# Patient Record
Sex: Female | Born: 1965 | Race: White | Hispanic: No | Marital: Married | Smoking: Current every day smoker
Health system: Southern US, Community
[De-identification: ages and names within clinical notes are randomized; demographics above are authoritative.]

## PROBLEM LIST (undated history)

## (undated) DIAGNOSIS — Z9289 Personal history of other medical treatment: Secondary | ICD-10-CM

## (undated) DIAGNOSIS — K648 Other hemorrhoids: Secondary | ICD-10-CM

## (undated) DIAGNOSIS — E785 Hyperlipidemia, unspecified: Secondary | ICD-10-CM

## (undated) DIAGNOSIS — I471 Supraventricular tachycardia, unspecified: Secondary | ICD-10-CM

## (undated) DIAGNOSIS — E0590 Thyrotoxicosis, unspecified without thyrotoxic crisis or storm: Secondary | ICD-10-CM

## (undated) DIAGNOSIS — F419 Anxiety disorder, unspecified: Secondary | ICD-10-CM

## (undated) DIAGNOSIS — R0602 Shortness of breath: Secondary | ICD-10-CM

## (undated) DIAGNOSIS — O34219 Maternal care for unspecified type scar from previous cesarean delivery: Secondary | ICD-10-CM

## (undated) HISTORY — DX: Maternal care for unspecified type scar from previous cesarean delivery: O34.219

## (undated) HISTORY — PX: ABDOMINAL HYSTERECTOMY: SHX81

## (undated) HISTORY — DX: Other hemorrhoids: K64.8

## (undated) HISTORY — DX: Shortness of breath: R06.02

## (undated) HISTORY — DX: Hyperlipidemia, unspecified: E78.5

## (undated) HISTORY — PX: ABLATION OF DYSRHYTHMIC FOCUS: SHX254

## (undated) HISTORY — PX: THYROIDECTOMY: SHX17

## (undated) HISTORY — DX: Thyrotoxicosis, unspecified without thyrotoxic crisis or storm: E05.90

## (undated) HISTORY — DX: Personal history of other medical treatment: Z92.89

---

## 1994-10-22 DIAGNOSIS — O34219 Maternal care for unspecified type scar from previous cesarean delivery: Secondary | ICD-10-CM

## 1994-10-22 HISTORY — DX: Maternal care for unspecified type scar from previous cesarean delivery: O34.219

## 2005-01-03 ENCOUNTER — Ambulatory Visit: Payer: Self-pay

## 2006-02-20 ENCOUNTER — Emergency Department: Payer: Self-pay | Admitting: General Practice

## 2008-04-07 ENCOUNTER — Ambulatory Visit: Payer: Self-pay

## 2009-02-04 ENCOUNTER — Ambulatory Visit: Payer: Self-pay | Admitting: Obstetrics & Gynecology

## 2009-02-04 HISTORY — PX: HYSTEROSALPINGOGRAM: SHX6581

## 2009-10-19 HISTORY — PX: ENDOMETRIAL BIOPSY: SHX622

## 2009-11-02 ENCOUNTER — Ambulatory Visit: Payer: Self-pay

## 2009-11-08 ENCOUNTER — Ambulatory Visit: Payer: Self-pay

## 2010-12-30 ENCOUNTER — Ambulatory Visit: Payer: Self-pay | Admitting: Family Medicine

## 2011-07-06 ENCOUNTER — Ambulatory Visit: Payer: Self-pay

## 2011-12-10 ENCOUNTER — Ambulatory Visit: Payer: Self-pay | Admitting: Internal Medicine

## 2012-08-01 DIAGNOSIS — Z9289 Personal history of other medical treatment: Secondary | ICD-10-CM

## 2012-08-01 HISTORY — DX: Personal history of other medical treatment: Z92.89

## 2013-12-30 ENCOUNTER — Ambulatory Visit: Payer: Self-pay | Admitting: Physician Assistant

## 2014-01-21 ENCOUNTER — Ambulatory Visit: Payer: Self-pay | Admitting: Physician Assistant

## 2015-01-24 ENCOUNTER — Ambulatory Visit (INDEPENDENT_AMBULATORY_CARE_PROVIDER_SITE_OTHER): Payer: BLUE CROSS/BLUE SHIELD

## 2015-01-24 ENCOUNTER — Ambulatory Visit (INDEPENDENT_AMBULATORY_CARE_PROVIDER_SITE_OTHER): Payer: BLUE CROSS/BLUE SHIELD | Admitting: Podiatry

## 2015-01-24 ENCOUNTER — Encounter: Payer: Self-pay | Admitting: Podiatry

## 2015-01-24 VITALS — BP 148/93 | HR 72 | Resp 16 | Ht 64.0 in | Wt 145.0 lb

## 2015-01-24 DIAGNOSIS — M79672 Pain in left foot: Secondary | ICD-10-CM | POA: Diagnosis not present

## 2015-01-24 DIAGNOSIS — G5762 Lesion of plantar nerve, left lower limb: Secondary | ICD-10-CM | POA: Diagnosis not present

## 2015-01-24 NOTE — Progress Notes (Signed)
   Subjective:    Patient ID: Shannon Patterson, female    DOB: 06/01/1966, 49 y.o.   MRN: [id number]  HPI    Review of Systems  Cardiovascular: Positive for palpitations.  All other systems reviewed and are negative.      Objective:   Physical Exam        Assessment & Plan:

## 2015-01-24 NOTE — Progress Notes (Signed)
   Subjective:    Patient ID: Shannon Patterson, female    DOB: [birth year], 49 y.o.   MRN: [id number] " My foot hurts "  Pt complains of left hurt hurting , the whole foot and ankle . She has seen ortho, iced, wrapped, she has stopped exercise, nothing seems to work. She has had a Xray at [facility] last week .   HPI       Review of Systems     Objective:   Physical Exam I have reviewed her past medical history medications allergies surgery social history and review of systems. Pulses are strongly palpable neurologic sensorium is remarkable for a painful Mulder's click to the third interdigital space of the left foot. Deep tendon reflexes are intact muscle strength +5 over 5 dorsiflexion plantar flexors and inverters everters all intrinsic musculature is intact. Orthopedic evaluation of his roots all joints distal to the ankle for range of motion without crepitation. Radiographic evaluation does not instructed any type of osseus abnormalities in the foot.        Assessment & Plan:  Assessment: Neuroma third interdigital space left foot.  Plan: Injected the third intermetatarsal space today with Kenalog and local anesthetic left foot. Discussed appropriate shoe gear stretching exercises ice therapy follow-up with her in 1 month.

## 2015-02-09 DIAGNOSIS — M84375A Stress fracture, left foot, initial encounter for fracture: Secondary | ICD-10-CM | POA: Insufficient documentation

## 2015-02-21 ENCOUNTER — Ambulatory Visit: Payer: BLUE CROSS/BLUE SHIELD | Admitting: Podiatry

## 2015-03-30 DIAGNOSIS — M7672 Peroneal tendinitis, left leg: Secondary | ICD-10-CM | POA: Insufficient documentation

## 2015-08-01 IMAGING — CR RIGHT INDEX FINGER 2+V
1 series · 3 of 3 positions shown · non-contrast
Comparison: none

[Series 1: pa · 0.17mm/px · 3 of 3 slices shown]
[im 1/3]
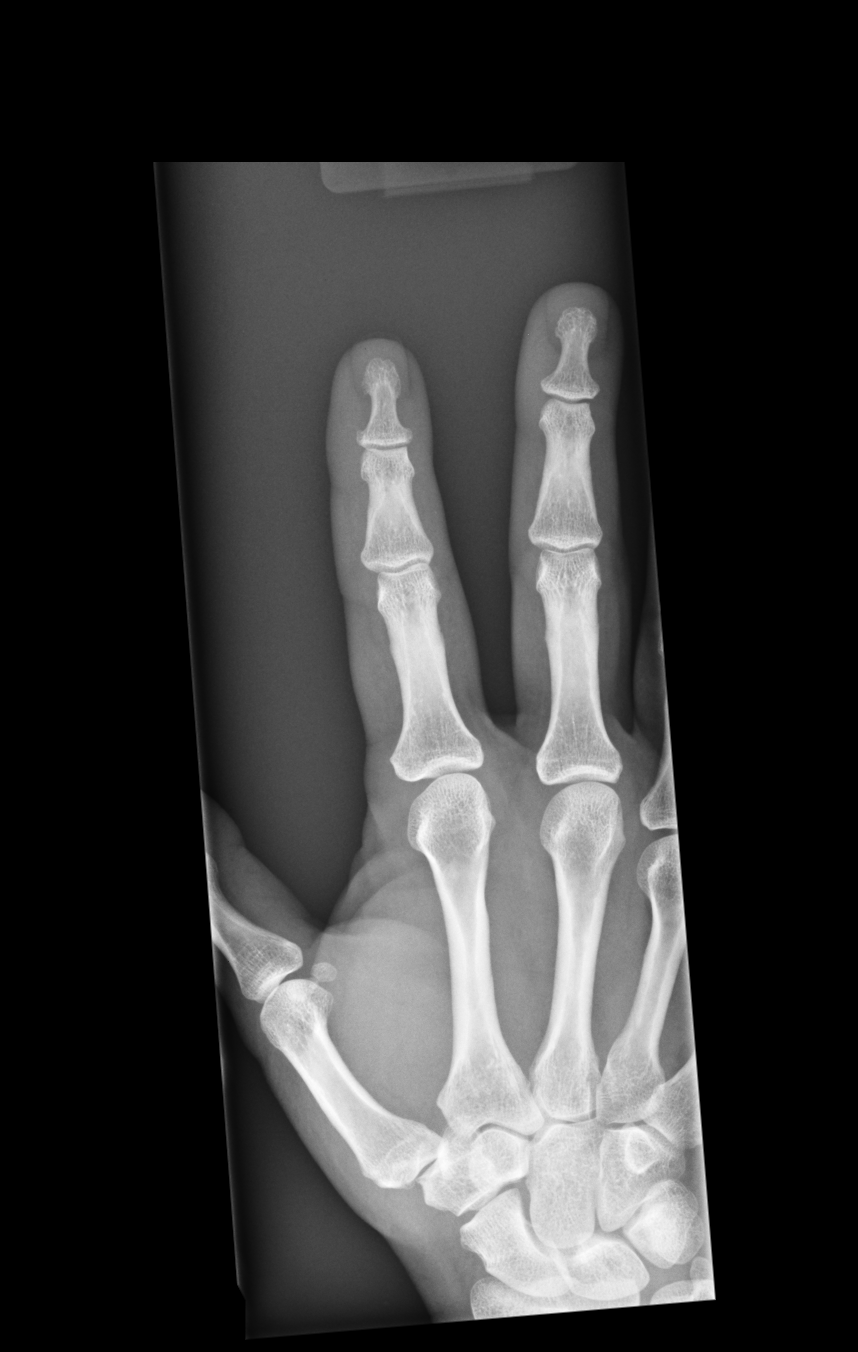
[im 2/3]
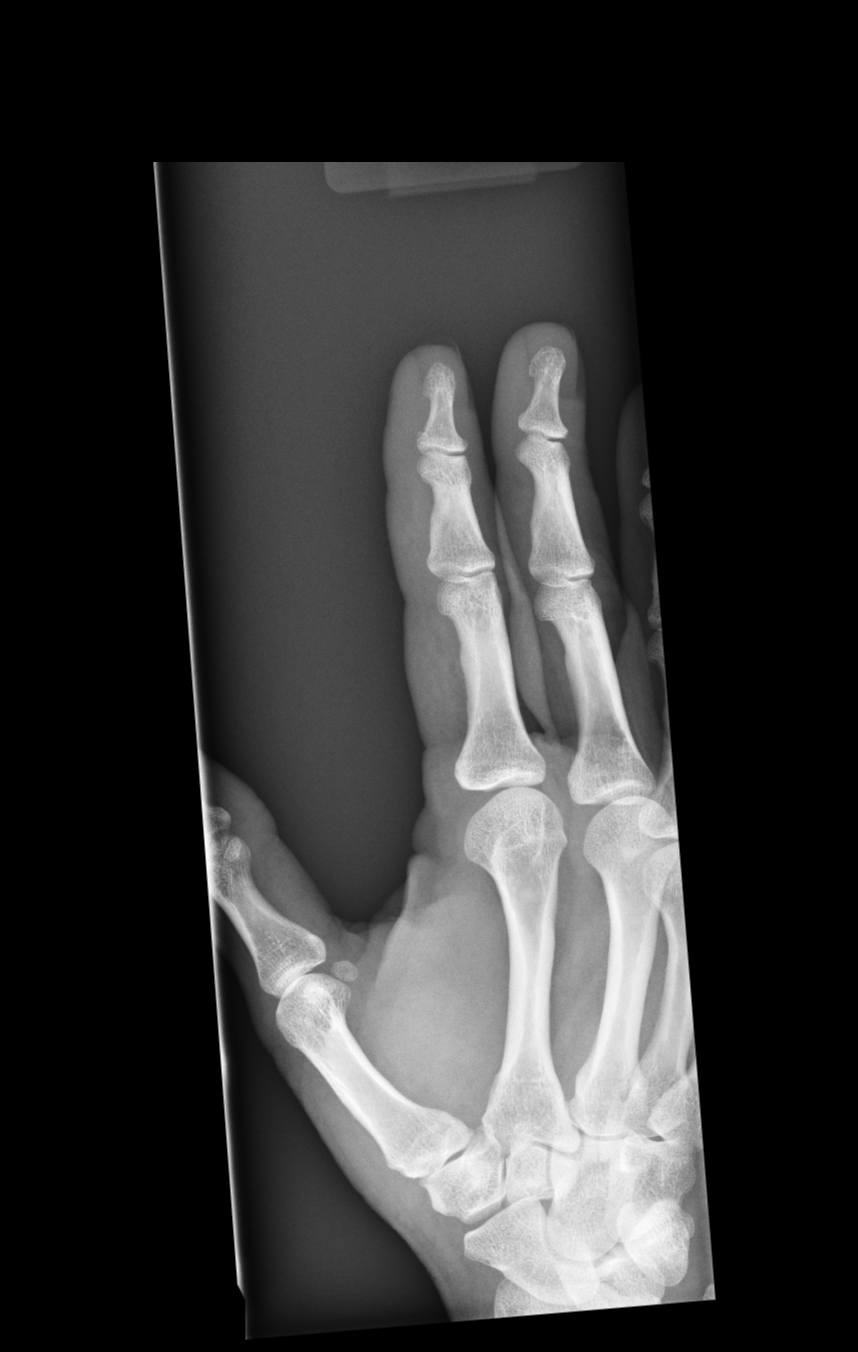
[im 3/3]
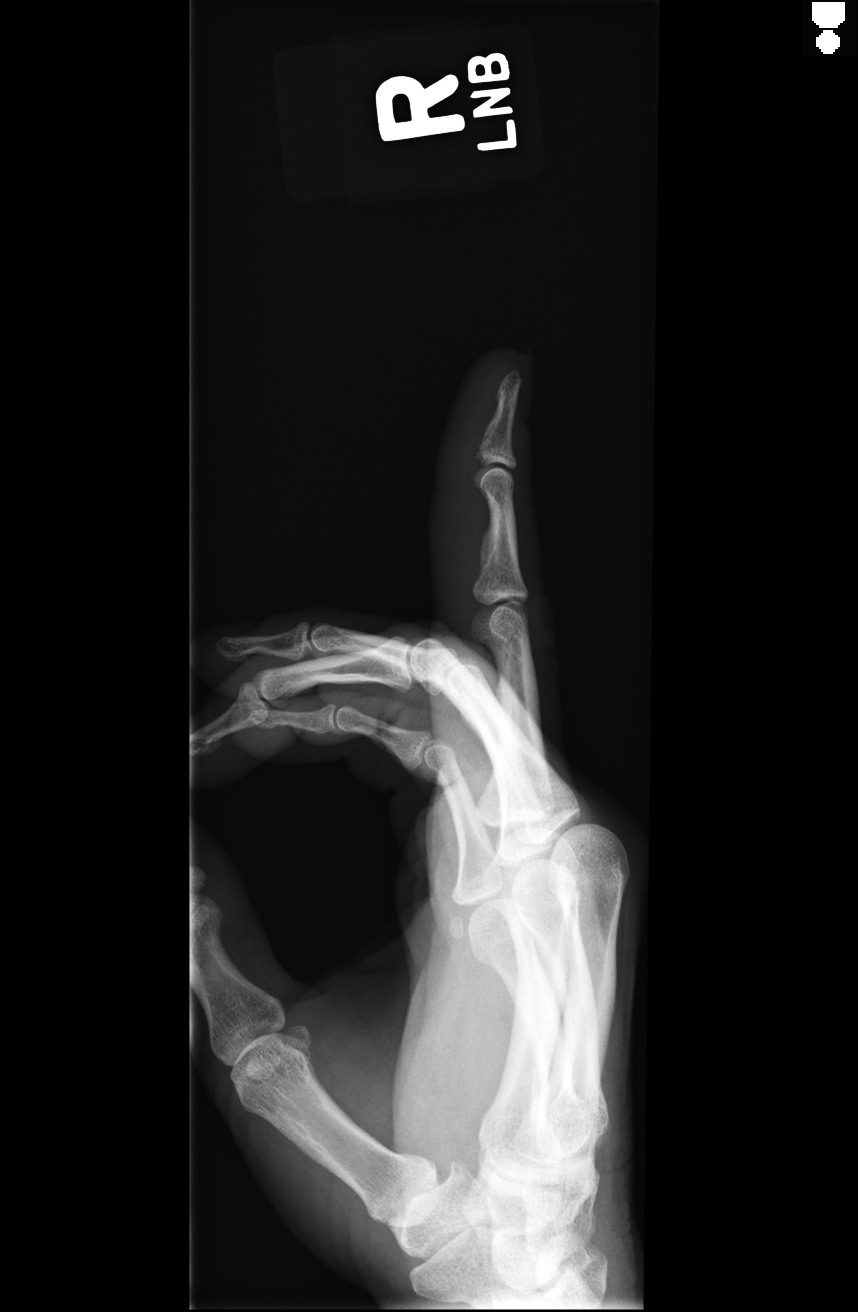

[3 of 3 positions shown; findings below may reference images not displayed]

CLINICAL DATA
Second digit pain

EXAM
RIGHT INDEX FINGER 2+V

COMPARISON
None.

FINDINGS
There is no evidence of fracture or dislocation. There is no
evidence of arthropathy or other focal bone abnormality. Soft
tissues are unremarkable.

IMPRESSION
No acute abnormality noted.

SIGNATURE

## 2015-09-24 ENCOUNTER — Emergency Department
Admission: EM | Admit: 2015-09-24 | Discharge: 2015-09-24 | Disposition: A | Discharge status: Home / Self Care | Payer: BLUE CROSS/BLUE SHIELD | Attending: Emergency Medicine | Admitting: Emergency Medicine

## 2015-09-24 ENCOUNTER — Encounter: Payer: Self-pay | Admitting: Emergency Medicine

## 2015-09-24 DIAGNOSIS — F17200 Nicotine dependence, unspecified, uncomplicated: Secondary | ICD-10-CM | POA: Diagnosis not present

## 2015-09-24 DIAGNOSIS — R000 Tachycardia, unspecified: Secondary | ICD-10-CM | POA: Diagnosis present

## 2015-09-24 DIAGNOSIS — F419 Anxiety disorder, unspecified: Secondary | ICD-10-CM | POA: Diagnosis not present

## 2015-09-24 DIAGNOSIS — I471 Supraventricular tachycardia: Secondary | ICD-10-CM | POA: Insufficient documentation

## 2015-09-24 HISTORY — DX: Supraventricular tachycardia: I47.1

## 2015-09-24 HISTORY — DX: Supraventricular tachycardia, unspecified: I47.10

## 2015-09-24 HISTORY — DX: Anxiety disorder, unspecified: F41.9

## 2015-09-24 LAB — COMPREHENSIVE METABOLIC PANEL
ALBUMIN: 5.2 g/dL — AB (ref 3.5–5.0)
ALK PHOS: 97 U/L (ref 38–126)
ALT: 21 U/L (ref 14–54)
AST: 30 U/L (ref 15–41)
Anion gap: 11 (ref 5–15)
BUN: 12 mg/dL (ref 6–20)
CALCIUM: 10.2 mg/dL (ref 8.9–10.3)
CO2: 26 mmol/L (ref 22–32)
CREATININE: 0.84 mg/dL (ref 0.44–1.00)
Chloride: 103 mmol/L (ref 101–111)
Glucose, Bld: 129 mg/dL — ABNORMAL HIGH (ref 65–99)
Potassium: 3.2 mmol/L — ABNORMAL LOW (ref 3.5–5.1)
Sodium: 140 mmol/L (ref 135–145)
Total Bilirubin: 0.6 mg/dL (ref 0.3–1.2)
Total Protein: 8.6 g/dL — ABNORMAL HIGH (ref 6.5–8.1)

## 2015-09-24 LAB — CBC
HEMATOCRIT: 49.6 % — AB (ref 35.0–47.0)
HEMOGLOBIN: 16.9 g/dL — AB (ref 12.0–16.0)
MCH: 33.4 pg (ref 26.0–34.0)
MCHC: 34.1 g/dL (ref 32.0–36.0)
MCV: 98 fL (ref 80.0–100.0)
Platelets: 271 10*3/uL (ref 150–440)
RBC: 5.06 MIL/uL (ref 3.80–5.20)
RDW: 13.7 % (ref 11.5–14.5)
WBC: 8.4 10*3/uL (ref 3.6–11.0)

## 2015-09-24 LAB — TROPONIN I

## 2015-09-24 NOTE — Discharge Instructions (Signed)
Paroxysmal Supraventricular Tachycardia Paroxysmal supraventricular tachycardia (PSVT) is a type of abnormal heart rhythm. It causes your heart to beat very quickly and then suddenly stop beating so quickly. A normal heart rate is 60-100 beats per minute. During an episode of PSVT, your heart rate may be 150-250 beats per minute. This can make you feel light-headed and short of breath. An episode of PSVT can be frightening. It is usually not dangerous. The heart has four chambers. All chambers need to work together for the heart to beat effectively. A normal heartbeat usually starts in the right upper chamber of the heart (atrium) when an area (sinoatrial node) puts out an electrical signal that spreads to the other chambers. People with PSVT may have abnormal electrical pathways, or they may have other areas in the upper chambers that send out electrical signals. The result is a very rapid heartbeat. When your heart beats very quickly, it does not have time to fill completely with blood. When PSVT happens often or it lasts for long periods, it can lead to heart weakness and failure. Most people with PSVT do not have any other heart disease. CAUSES Abnormal electrical activity in the heart causes PSVT. It is not known why some people get PSVT and others do not. RISK FACTORS You may be more likely to have PSVT if:  You are 20-30 years old.  You are a woman. Other factors that may increase your chances of an attack include:  Stress.  Being tired.  Smoking.  Stimulant drugs.  Alcoholic drinks.  Caffeine.  Pregnancy. SIGNS AND SYMPTOMS A mild episode of PSVT may cause no symptoms. If you do have signs and symptoms, they may include:  A pounding heart.  Feeling of skipped heartbeats (palpitations).  Weakness.  Shortness of breath.  Tightness or pain in your chest.  Light-headedness.  Anxiety.  Dizziness.  Sweating.  Nausea.  A fainting spell. DIAGNOSIS Your health care  provider may suspect PSVT if you have symptoms that come and go. The health care provider will do a physical exam. If you are having an episode during the exam, the health care provider may be able to diagnose PSVT by listening to your heart and feeling your pulse. Tests may also be done, including:  An electrical study of your heart (electrocardiogram, or ECG).  A test in which you wear a portable ECG monitor all day (Holter monitor) or for several days (event monitor).  A test that involves taking an image of your heart using sound waves (echocardiogram) to rule out other causes of a fast heart rate. TREATMENT You may not need treatment if episodes of PSVT do not happen often or if they do not cause symptoms. If PSVT episodes do cause symptoms, your health care provider may first suggest trying a self-treatment called vagus nerve stimulation. The vagus nerve extends down from the brain. It regulates certain body functions. Stimulating this nerve can slow down the heart. Your health care provider can teach you ways to do this. You may need to try a few ways to find what works best for you. Options include:  Holding your breath and pushing, as though you are having a bowel movement.  Massaging an area on one side of your neck below your jaw.  Bending forward with your head between your legs.  Bending forward with your head between your legs and coughing.  Massaging your eyeballs with your eyes closed. If vagus nerve stimulation does not work, other treatment options include:    Medicines to prevent an attack.  Being treated in the hospital with medicine or electric shock to stop an attack (cardioversion). This treatment can include:  Getting medicine through an IV line.  Having a small electric shock delivered to your heart. You will be given medicine to make you sleep through this procedure.  If you have frequent episodes with symptoms, you may need a procedure to get rid of the faulty  areas of your heart (radiofrequency ablation) and end the episodes of PSVT. In this procedure:  A long, thin tube (catheter) is passed through one of your veins into your heart.  Energy directed through the catheter eliminates the areas of your heart that are causing abnormal electric stimulation. HOME CARE INSTRUCTIONS  Take medicines only as directed by your health care provider.  Do not use caffeine in any form if caffeine triggers episodes of PSVT. Otherwise, consume caffeine in moderation. This means no more than a few cups of coffee or the equivalent each day.  Do not drink alcohol if alcohol triggers episodes of PSVT. Otherwise, limit alcohol intake to no more than 1 drink per day for nonpregnant women and 2 drinks per day for men. One drink equals 12 ounces of beer, 5 ounces of wine, or 1 ounces of hard liquor.  Do not use any tobacco products, including cigarettes, chewing tobacco, or electronic cigarettes. If you need help quitting, ask your health care provider.  Try to get at least 7 hours of sleep each night.  Find healthy ways to manage stress.  Perform vagus nerve stimulation as directed by your health care provider.  Maintain a healthy weight.  Get some exercise on most days. Ask your health care provider to suggest some good activities for you. SEEK MEDICAL CARE IF:  You are having episodes of PSVT more often, or they are lasting longer.  Vagus nerve stimulation is no longer helping.  You have new symptoms during an episode. SEEK IMMEDIATE MEDICAL CARE IF:  You have chest pain or trouble breathing.  You have an episode of PSVT that has lasted longer than 20 minutes.  You have passed out from an episode of PSVT. These symptoms may represent a serious problem that is an emergency. Do not wait to see if the symptoms will go away. Get medical help right away. Call your local emergency services (911 in the [place] Do not drive yourself to the hospital.   This  information is not intended to replace advice given to you by your health care provider. Make sure you discuss any questions you have with your health care provider.   Document Released: 10/08/2005 Document Revised: 10/29/2014 Document Reviewed: 03/18/2014 Elsevier Interactive Patient Education 2016 [employer]  

## 2015-09-24 NOTE — ED Notes (Signed)
Pt to ed with c/o rapid heartbeat.  Pt states she has hx of SVT.  Pt states it started about 1 hour pta.  Reports she took 75mg  of metoprolol at home pta.

## 2015-09-24 NOTE — ED Notes (Signed)
Pt arrived with c/o of heart palpitations. States she has had similar occurences in the past that lasted no more than 20 mins but this has lasted 45+ mins. Pt states she has SOB and dizziness. Pt also states her PCP also recently incresed her synthroid dose.

## 2015-09-24 NOTE — ED Provider Notes (Signed)
[facility] Emergency Department Provider Note  ____________________________________________  Time seen: On arrival  I have reviewed the triage vital signs and the nursing notes.   HISTORY  Chief Complaint Tachycardia    HPI Shannon Patterson is a 49 y.o. female who presents with heart palpitations. Patient reports a history of SVT but notes it has been several years since she has had an episode. She reports symptoms started about one hour prior to arrival and this is lasted much longer than typically. She did take 600 mg of metoprolol at home prior to arrival. She reports chest tightness but no pain. No recent travel. No calf pain. No fevers chills or shortness of breath.     Past Medical History  Diagnosis Date  . SVT (supraventricular tachycardia) ([place])   . Anxiety     There are no active problems to display for this patient.   History reviewed. No pertinent past surgical history.  No current outpatient prescriptions on file.  Allergies Procaine  History reviewed. No pertinent family history.  Social History Social History  Substance Use Topics  . Smoking status: Current Every Day Smoker  . Smokeless tobacco: None  . Alcohol Use: 0.0 oz/week    0 Standard drinks or equivalent per week    Review of Systems  Constitutional: Negative for fever. Eyes: Negative for visual changes. ENT: Negative for sore throat Cardiovascular: Positive for palpitations Respiratory: Negative for shortness of breath. Gastrointestinal: Negative for abdominal pain, vomiting and diarrhea. Genitourinary: Negative for dysuria. Musculoskeletal: Negative for back pain. Skin: Negative for rash. Neurological: Negative for headaches or focal weakness Psychiatric: Mild anxiety    ____________________________________________   PHYSICAL EXAM:  VITAL SIGNS: ED Triage Vitals  Enc Vitals Group     BP 09/24/15 1338 162/123 mmHg     Pulse Rate 09/24/15 1338 167      Resp 09/24/15 1338 20     Temp 09/24/15 1338 98.2 F (36.8 C)     Temp Source 09/24/15 1338 Oral     SpO2 09/24/15 1338 100 %     Weight 09/24/15 1338 140 lb (63.504 kg)     Height 09/24/15 1338 5\' 4"  (1.626 m)     Head Cir --      Peak Flow --      Pain Score 09/24/15 1339 0     Pain Loc --      Pain Edu? --      Excl. in [place]? --      Constitutional: Alert and oriented. Well appearing and in no distress. Eyes: Conjunctivae are normal.  ENT   Head: Normocephalic and atraumatic.   Mouth/Throat: Mucous membranes are moist. Cardiovascular: tachycardia. Normal and symmetric distal pulses are present in all extremities. No murmurs, rubs, or gallops. Respiratory: Normal respiratory effort without tachypnea nor retractions. Breath sounds are clear and equal bilaterally.  Gastrointestinal: Soft and non-tender in all quadrants. No distention. There is no CVA tenderness. Genitourinary: deferred Musculoskeletal: Nontender with normal range of motion in all extremities. No lower extremity tenderness nor edema. Neurologic:  Normal speech and language. No gross focal neurologic deficits are appreciated. Skin:  Skin is warm, dry and intact. No rash noted. Psychiatric: Mood and affect are normal. Patient exhibits appropriate insight and judgment.  ____________________________________________    LABS (pertinent positives/negatives)  Labs Reviewed  CBC - Abnormal; Notable for the following:    Hemoglobin 16.9 (*)    HCT 49.6 (*)    All other components within normal limits  COMPREHENSIVE METABOLIC PANEL - Abnormal; Notable for the following:    Potassium 3.2 (*)    Glucose, Bld 129 (*)    Total Protein 8.6 (*)    Albumin 5.2 (*)    All other components within normal limits  TROPONIN I    ____________________________________________   EKG  ED ECG REPORT I, Lavonia Drafts, the attending physician, personally viewed and interpreted this ECG.   Date: 09/24/2015  EKG  Time: 1:40 PM  Rate: 165  Rhythm: Supraventricular tachycardia  Axis: Normal axis  Intervals:none  ST&T Change: Nonspecific  2. ED ECG REPORT I, Lavonia Drafts, the attending physician, personally viewed and interpreted this ECG.  Date: 09/24/2015 EKG Time: 1:51 PM Rate: 92 Rhythm: normal sinus rhythm QRS Axis: normal Intervals: normal ST/T Wave abnormalities: normal Conduction Disutrbances: none Narrative Interpretation: unremarkable    ____________________________________________    RADIOLOGY I have personally reviewed any xrays that were ordered on this patient: None  ____________________________________________   PROCEDURES  Procedure(s) performed: none  Critical Care performed: none  ____________________________________________   INITIAL IMPRESSION / ASSESSMENT AND PLAN / ED COURSE  Pertinent labs & imaging results that were available during my care of the patient were reviewed by me and considered in my medical decision making (see chart for details).  Patient presents with symptoms and EKG consistent with SVT. Prior to her getting adenosine the patient converted spontaneously. Her symptoms have resolved and her labs are unremarkable. Her repeat EKG is normal  ----------------------------------------- 3:16 PM on 09/24/2015 -----------------------------------------  Patient is feeling well, heart rate continues to be normal. Labs unremarkable. She is okay for discharge with cardiology follow-up. Return precautions discussed  ____________________________________________   FINAL CLINICAL IMPRESSION(S) / ED DIAGNOSES  Final diagnoses:  SVT (supraventricular tachycardia) ([facility])     Lavonia Drafts, MD 09/24/15 [phone]

## 2015-09-24 NOTE — ED Notes (Signed)
Pt verbalized understanding of discharge instructions. NAD at this time. 

## 2015-09-28 DIAGNOSIS — I471 Supraventricular tachycardia, unspecified: Secondary | ICD-10-CM | POA: Insufficient documentation

## 2015-09-28 DIAGNOSIS — R0602 Shortness of breath: Secondary | ICD-10-CM | POA: Insufficient documentation

## 2015-09-28 DIAGNOSIS — R0789 Other chest pain: Secondary | ICD-10-CM | POA: Insufficient documentation

## 2015-09-28 HISTORY — DX: Shortness of breath: R06.02

## 2015-12-07 ENCOUNTER — Ambulatory Visit
Admission: EM | Admit: 2015-12-07 | Discharge: 2015-12-07 | Disposition: A | Discharge status: Home / Self Care | Payer: BLUE CROSS/BLUE SHIELD | Attending: Family Medicine | Admitting: Family Medicine

## 2015-12-07 DIAGNOSIS — L259 Unspecified contact dermatitis, unspecified cause: Secondary | ICD-10-CM | POA: Diagnosis not present

## 2015-12-07 MED ORDER — PREDNISONE 20 MG PO TABS: 20.0000 mg | ORAL_TABLET | Freq: Every day | ORAL | Status: DC

## 2015-12-07 MED ORDER — TRIAMCINOLONE ACETONIDE 0.025 % EX OINT: 1.0000 "application " | TOPICAL_OINTMENT | Freq: Two times a day (BID) | CUTANEOUS | Status: DC

## 2015-12-07 NOTE — ED Provider Notes (Signed)
CSN: [id number]     Arrival date & time 12/07/15  1009 History   First MD Initiated Contact with Patient 12/07/15 1036     Chief Complaint  Patient presents with  . Rash   (Consider location/radiation/quality/duration/timing/severity/associated sxs/prior Treatment) HPI Comments: 50 yo female with a  3 days h/o itchy, weeping, rash on legs, arms and neck after exposure to poison ivy at home back yard, 4 days ago. Patient has tried otc topical medications without relief. Denies any fevers, chills, wheezing.  The history is provided by the patient.    Past Medical History  Diagnosis Date  . SVT (supraventricular tachycardia) ([place])   . Anxiety    Past Surgical History  Procedure Laterality Date  . Abdominal hysterectomy     History reviewed. No pertinent family history. Social History  Substance Use Topics  . Smoking status: Current Every Day Smoker  . Smokeless tobacco: None  . Alcohol Use: 0.0 oz/week    0 Standard drinks or equivalent per week   OB History    No data available     Review of Systems  Allergies  Lidocaine and Procaine  Home Medications   Prior to Admission medications   Medication Sig Start Date End Date Taking? Authorizing Provider  levothyroxine (SYNTHROID, LEVOTHROID) 125 MCG tablet Take 125 mcg by mouth daily before breakfast.   Yes Historical Provider, MD  metoprolol succinate (TOPROL-XL) 50 MG 24 hr tablet Take 50 mg by mouth daily. Take with or immediately following a meal.   Yes Historical Provider, MD  LORazepam (ATIVAN) 0.5 MG tablet Take 0.5 mg by mouth every 8 (eight) hours.    Historical Provider, MD  predniSONE (DELTASONE) 20 MG tablet Take 1 tablet (20 mg total) by mouth daily. 12/07/15   Norval Gable, MD  triamcinolone (KENALOG) 0.025 % ointment Apply 1 application topically 2 (two) times daily. 12/07/15   Norval Gable, MD   Meds Ordered and Administered this Visit  Medications - No data to display  Pulse 58  Temp(Src) 97.6 F (36.4  C) (Tympanic)  Resp 16  Ht 5\' 4"  (1.626 m)  Wt 145 lb (65.772 kg)  BMI 24.88 kg/m2  SpO2 100%  LMP  (LMP Unknown) No data found.   Physical Exam  Constitutional: She appears well-developed and well-nourished. No distress.  Skin: Rash noted. Rash is vesicular. She is not diaphoretic. There is erythema.     Nursing note and vitals reviewed.   ED Course  Procedures (including critical care time)  Labs Review Labs Reviewed - No data to display  Imaging Review No results found.   Visual Acuity Review  Right Eye Distance:   Left Eye Distance:   Bilateral Distance:    Right Eye Near:   Left Eye Near:    Bilateral Near:         MDM   1. Contact dermatitis    Discharge Medication List as of 12/07/2015 10:49 AM    START taking these medications   Details  predniSONE (DELTASONE) 20 MG tablet Take 1 tablet (20 mg total) by mouth daily., Starting 12/07/2015, Until Discontinued, Normal    triamcinolone (KENALOG) 0.025 % ointment Apply 1 application topically 2 (two) times daily., Starting 12/07/2015, Until Discontinued, Normal       1. diagnosis reviewed with patient 2. rx as per orders above; reviewed possible side effects, interactions, risks and benefits  3. Recommend supportive treatment with otc antihistamines prn itching 4. Follow-up prn if symptoms worsen or don't improve  Norval Gable, MD 12/07/15 1058

## 2015-12-07 NOTE — ED Notes (Signed)
States was doing yard work Sunday and yesterday notice "poison ivy" rash lower legs and bilateral arms

## 2016-12-25 ENCOUNTER — Ambulatory Visit (INDEPENDENT_AMBULATORY_CARE_PROVIDER_SITE_OTHER): Payer: BLUE CROSS/BLUE SHIELD | Admitting: Obstetrics and Gynecology

## 2016-12-25 ENCOUNTER — Encounter: Payer: Self-pay | Admitting: Obstetrics and Gynecology

## 2016-12-25 VITALS — BP 130/90 | Ht 63.0 in | Wt 148.0 lb

## 2016-12-25 DIAGNOSIS — Z79890 Hormone replacement therapy (postmenopausal): Secondary | ICD-10-CM | POA: Diagnosis not present

## 2016-12-25 DIAGNOSIS — Z124 Encounter for screening for malignant neoplasm of cervix: Secondary | ICD-10-CM

## 2016-12-25 DIAGNOSIS — Z01419 Encounter for gynecological examination (general) (routine) without abnormal findings: Secondary | ICD-10-CM

## 2016-12-25 DIAGNOSIS — Z1239 Encounter for other screening for malignant neoplasm of breast: Secondary | ICD-10-CM

## 2016-12-25 DIAGNOSIS — Z1231 Encounter for screening mammogram for malignant neoplasm of breast: Secondary | ICD-10-CM

## 2016-12-25 MED ORDER — ESTRADIOL 0.0375 MG/24HR TD PTTW: 1.0000 | MEDICATED_PATCH | TRANSDERMAL | 11 refills | Status: DC

## 2016-12-25 NOTE — Progress Notes (Signed)
HPI:      Ms. KATORA FINI is a 51 y.o. [phone] who LMP was No LMP recorded (lmp unknown). Patient has had a hysterectomy., presents today for her annual examination.  Her menses are absent due to supracx hysterectomy.  She has vasomotor sx. She uses minivelle sporadically with mostly sx relief.   She is single partner, contraception - status post hysterectomy.  Last Pap: 09/01/12. Results were: no abnormalities  /neg HPV DNA.   Last mammogram: 02/22/15. Results were: normal--routine follow-up in 12 months There is a FH of breast cancer in her mother/MGM. Mom was neg for BRCA1 and 2, but genetic testing not indicated due to age of dx. No FH ovarian cancer. The patient does do self-breast exams.  Tobacco use: The patient currently smokes 1 packs of cigarettes per day for the past many years. Alcohol use: social drinker Exercise: moderately active  Labs done with PCP.  She does get adequate calcium and Vitamin D in her diet.  Past Medical History:  Diagnosis Date  . Anxiety   . History of Papanicolaou smear of cervix 08/01/2012   -/-  . Hyperlipidemia   . Hyperthyroidism   . SVT (supraventricular tachycardia) ([place])   . VBAC (vaginal birth after Cesarean) 1996    Past Surgical History:  Procedure Laterality Date  . ABDOMINAL HYSTERECTOMY  ~2000   HAS OVARIES/CX; [facility]  . CESAREAN SECTION  1991   FTP; [facility]  . 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 HYSTEROSALPINGOGRAM  02/04/2009  . THYROIDECTOMY     PT WAS IN HER 20s    Family History  Problem Relation Age of Onset  . Breast cancer Mother 87  . Colon polyps Father   . Breast cancer Maternal Grandmother 80     ROS:  Review of Systems  Constitutional: Negative for fever, malaise/fatigue and weight loss.  HENT: Negative for congestion, ear pain and sinus pain.   Respiratory: Negative for cough, shortness of breath and wheezing.   Cardiovascular: Negative for chest pain, orthopnea and leg  swelling.  Gastrointestinal: Negative for constipation, diarrhea, nausea and vomiting.  Genitourinary: Negative for dysuria, frequency, hematuria and urgency.       Breast ROS: negative   Musculoskeletal: Negative for back pain, joint pain and myalgias.  Skin: Negative for itching and rash.  Neurological: Negative for dizziness, tingling, focal weakness and headaches.  Endo/Heme/Allergies: Negative for environmental allergies. Does not bruise/bleed easily.  Psychiatric/Behavioral: Negative for depression and suicidal ideas. The patient is not nervous/anxious and does not have insomnia.     Objective: BP 130/90   Ht _0  (1.6 m)   Wt 148 lb (67.1 kg)   LMP  (LMP Unknown)   BMI 26.22 kg/m    Physical Exam  Constitutional: She is oriented to person, place, and time. She appears well-developed and well-nourished.  Genitourinary: Vagina normal. No erythema or tenderness in the vagina. No vaginal discharge found. Right adnexum does not display mass and does not display tenderness. Left adnexum does not display mass and does not display tenderness.  Neck: Normal range of motion. No thyromegaly present.  Cardiovascular: Normal rate, regular rhythm and normal heart sounds.   No murmur heard. Pulmonary/Chest: Effort normal and breath sounds normal. Right breast exhibits no mass, no nipple discharge, no skin change and no tenderness. Left breast exhibits no mass, no nipple discharge, no skin change and no tenderness.  Abdominal: Soft. There is no tenderness. There is no guarding.  Musculoskeletal: Normal range of motion.  Neurological: She is alert and oriented to person, place, and time. No cranial nerve deficit.  Psychiatric: She has a normal mood and affect. Her behavior is normal.  Vitals reviewed.   Results: No results found for this or any previous visit (from the past 24 hour(s)).  Assessment: There are no diagnoses linked to this encounter.Encounter for annual routine  gynecological examination  Screening for cervical cancer - Plan: Pap IG and HPV (high risk) DNA detection  Screening for breast cancer - Plan: MM DIGITAL SCREENING BILATERAL  Postmenopausal HRT (hormone replacement therapy)    Plan:            GYN counsel breast self exam, mammography screening, menopause, adequate intake of calcium and vitamin D     F/U  Return in about 1 year (around 12/25/2017).  Alicia B. Copland, PA-C 12/25/2016 12:01 PM

## 2016-12-27 LAB — PAP IG AND HPV HIGH-RISK
HPV, HIGH-RISK: NEGATIVE
PAP SMEAR COMMENT: 0

## 2017-02-11 ENCOUNTER — Other Ambulatory Visit: Payer: Self-pay | Admitting: Obstetrics and Gynecology

## 2017-02-12 ENCOUNTER — Other Ambulatory Visit: Payer: Self-pay | Admitting: Obstetrics and Gynecology

## 2017-02-12 MED ORDER — ESTRADIOL 0.0375 MG/24HR TD PTTW: 1.0000 | MEDICATED_PATCH | TRANSDERMAL | 11 refills | Status: DC

## 2017-03-06 ENCOUNTER — Ambulatory Visit
Admission: RE | Admit: 2017-03-06 | Discharge: 2017-03-06 | Disposition: A | Discharge status: Home / Self Care | Payer: BLUE CROSS/BLUE SHIELD | Source: Ambulatory Visit | Attending: Obstetrics and Gynecology | Admitting: Obstetrics and Gynecology

## 2017-03-06 DIAGNOSIS — Z1231 Encounter for screening mammogram for malignant neoplasm of breast: Secondary | ICD-10-CM | POA: Diagnosis present

## 2017-03-06 DIAGNOSIS — Z1239 Encounter for other screening for malignant neoplasm of breast: Secondary | ICD-10-CM

## 2017-03-21 DIAGNOSIS — F419 Anxiety disorder, unspecified: Secondary | ICD-10-CM | POA: Insufficient documentation

## 2017-03-21 DIAGNOSIS — E890 Postprocedural hypothyroidism: Secondary | ICD-10-CM | POA: Insufficient documentation

## 2017-03-21 DIAGNOSIS — R1084 Generalized abdominal pain: Secondary | ICD-10-CM | POA: Insufficient documentation

## 2017-03-21 DIAGNOSIS — F17200 Nicotine dependence, unspecified, uncomplicated: Secondary | ICD-10-CM | POA: Insufficient documentation

## 2017-03-21 DIAGNOSIS — M2550 Pain in unspecified joint: Secondary | ICD-10-CM | POA: Insufficient documentation

## 2017-03-21 DIAGNOSIS — E7800 Pure hypercholesterolemia, unspecified: Secondary | ICD-10-CM | POA: Insufficient documentation

## 2017-03-25 ENCOUNTER — Encounter: Payer: Self-pay | Admitting: Obstetrics and Gynecology

## 2018-01-24 ENCOUNTER — Encounter: Payer: Self-pay | Admitting: Family Medicine

## 2018-01-24 ENCOUNTER — Ambulatory Visit: Payer: BLUE CROSS/BLUE SHIELD | Admitting: Family Medicine

## 2018-01-24 VITALS — BP 120/64 | HR 100 | Ht 64.0 in | Wt 148.0 lb

## 2018-01-24 DIAGNOSIS — Z7689 Persons encountering health services in other specified circumstances: Secondary | ICD-10-CM

## 2018-01-24 DIAGNOSIS — K625 Hemorrhage of anus and rectum: Secondary | ICD-10-CM | POA: Diagnosis not present

## 2018-01-24 DIAGNOSIS — R1084 Generalized abdominal pain: Secondary | ICD-10-CM | POA: Diagnosis not present

## 2018-01-24 LAB — HEMOCCULT GUIAC POC 1CARD (OFFICE): Fecal Occult Blood, POC: POSITIVE — AB

## 2018-01-24 NOTE — Progress Notes (Signed)
Name: Shannon Patterson   MRN: [id number]    DOB: 04-09-1966   Date:01/24/2018       Progress Note  Subjective  Chief Complaint  Chief Complaint  Patient presents with  . Establish Care    wants a pcp here  . Hemorrhoids    come out when having BM- even when not "pushing"- they are bleeding    GI Problem  The primary symptoms include fatigue, abdominal pain, hematochezia and dysuria. Primary symptoms do not include fever, weight loss, nausea, vomiting, diarrhea, melena, hematemesis, jaundice, myalgias, arthralgias or rash. Primary symptoms comment: abd pain 3 years. Episode onset: 2 weeks.  The hematochezia began more than 1 week ago. The hematochezia has occurred 1 time per day.  The dysuria is not associated with hematuria, frequency or urgency.  The illness does not include chills, anorexia, dysphagia, odynophagia, bloating, constipation, tenesmus, back pain or itching. Significant associated medical issues include hemorrhoids. Associated medical issues do not include inflammatory bowel disease, GERD, gallstones, liver disease, gastric bypass or bowel resection.    No problem-specific Assessment & Plan notes found for this encounter.   Past Medical History:  Diagnosis Date  . Anxiety   . History of Papanicolaou smear of cervix 08/01/2012   -/-  . Hyperlipidemia   . Hyperthyroidism   . SVT (supraventricular tachycardia) ([place])   . VBAC (vaginal birth after Cesarean) 1996    Past Surgical History:  Procedure Laterality Date  . ABDOMINAL HYSTERECTOMY  ~2000   HAS OVARIES/CX; PJR  . CESAREAN SECTION  1991   FTP; PJR  . 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 HYSTEROSALPINGOGRAM  02/04/2009  . THYROIDECTOMY     PT WAS IN HER 20s    Family History  Problem Relation Age of Onset  . Breast cancer Mother 66  . Cancer Mother   . Hypertension Mother   . Colon polyps Father   . Breast cancer Maternal Grandmother 80  . Cancer Maternal  Grandmother     Social History   Socioeconomic History  . Marital status: Married    Spouse name: Not on file  . Number of children: Not on file  . Years of education: Not on file  . Highest education level: Not on file  Occupational History  . Not on file  Social Needs  . Financial resource strain: Not on file  . Food insecurity:    Worry: Not on file    Inability: Not on file  . Transportation needs:    Medical: Not on file    Non-medical: Not on file  Tobacco Use  . Smoking status: Current Every Day Smoker    Packs/day: 1.00  . Smokeless tobacco: Never Used  Substance and Sexual Activity  . Alcohol use: Yes    Alcohol/week: 0.0 oz  . Drug use: No  . Sexual activity: Yes    Birth control/protection: Surgical  Lifestyle  . Physical activity:    Days per week: Not on file    Minutes per session: Not on file  . Stress: Not on file  Relationships  . Social connections:    Talks on phone: Not on file    Gets together: Not on file    Attends religious service: Not on file    Active member of club or organization: Not on file    Attends meetings of clubs or organizations: Not on file    Relationship status: Not on file  . Intimate partner  violence:    Fear of current or ex partner: Not on file    Emotionally abused: Not on file    Physically abused: Not on file    Forced sexual activity: Not on file  Other Topics Concern  . Not on file  Social History Narrative  . Not on file    Allergies  Allergen Reactions  . Lidocaine Other (See Comments)    tachycardia  . Prednisone     Other reaction(s): Other (See Comments) Very emotional -   . Procaine     Outpatient Medications Prior to Visit  Medication Sig Dispense Refill  . estradiol (MINIVELLE) 0.0375 MG/24HR Place 1 patch onto the skin 2 (two) times a week. 8 patch 11  . levothyroxine (SYNTHROID) 100 MCG tablet Take 100 mcg by mouth daily. Morayarti    . LORazepam (ATIVAN) 0.5 MG tablet Take 0.5 mg by mouth  every 8 (eight) hours. Morayarti    . metoprolol succinate (TOPROL-XL) 50 MG 24 hr tablet Take 50 mg by mouth daily. Take with or immediately following a meal.    . rosuvastatin (CRESTOR) 10 MG tablet Take 10 mg by mouth daily.    Marland Kitchen triamcinolone (KENALOG) 0.025 % ointment Apply 1 application topically 2 (two) times daily. (Patient not taking: Reported on 12/25/2016) 30 g 0  . metoprolol succinate (TOPROL-XL) 50 MG 24 hr tablet Take 50 mg by mouth daily.     No facility-administered medications prior to visit.     Review of Systems  Constitutional: Positive for fatigue. Negative for chills, fever, malaise/fatigue and weight loss.  HENT: Negative for ear discharge, ear pain and sore throat.   Eyes: Negative for blurred vision.  Respiratory: Negative for cough, sputum production, shortness of breath and wheezing.   Cardiovascular: Negative for chest pain, palpitations and leg swelling.  Gastrointestinal: Positive for abdominal pain and hematochezia. Negative for anorexia, bloating, blood in stool, constipation, diarrhea, dysphagia, heartburn, hematemesis, jaundice, melena, nausea and vomiting.  Genitourinary: Positive for dysuria. Negative for frequency, hematuria and urgency.  Musculoskeletal: Negative for arthralgias, back pain, joint pain, myalgias and neck pain.  Skin: Negative for itching and rash.  Neurological: Negative for dizziness, tingling, sensory change, focal weakness and headaches.  Endo/Heme/Allergies: Negative for environmental allergies and polydipsia. Does not bruise/bleed easily.  Psychiatric/Behavioral: Negative for depression and suicidal ideas. The patient is not nervous/anxious and does not have insomnia.      Objective  Vitals:   01/24/18 1412  BP: 120/64  Pulse: 100  Weight: 148 lb (67.1 kg)  Height: 5\' 4"  (1.626 m)    Physical Exam  Constitutional: She is well-developed, well-nourished, and in no distress. No distress.  HENT:  Head: Normocephalic and  atraumatic.  Right Ear: External ear normal.  Left Ear: External ear normal.  Nose: Nose normal.  Mouth/Throat: Oropharynx is clear and moist.  Eyes: Pupils are equal, round, and reactive to light. Conjunctivae and EOM are normal. Right eye exhibits no discharge. Left eye exhibits no discharge.  Neck: Normal range of motion. Neck supple. No JVD present. No thyromegaly present.  Cardiovascular: Normal rate, regular rhythm, normal heart sounds and intact distal pulses. Exam reveals no gallop and no friction rub.  No murmur heard. Pulmonary/Chest: Effort normal and breath sounds normal. She has no wheezes. She has no rales.  Abdominal: Soft. Bowel sounds are normal. She exhibits no mass. There is no tenderness. There is no guarding.  Genitourinary: Rectal exam shows external hemorrhoid, internal hemorrhoid and guaiac positive stool.  Rectal exam shows no fissure and no tenderness.  Musculoskeletal: Normal range of motion. She exhibits no edema.  Lymphadenopathy:    She has no cervical adenopathy.  Neurological: She is alert. She has normal reflexes.  Skin: Skin is warm and dry. She is not diaphoretic.  Psychiatric: Mood and affect normal.  Nursing note and vitals reviewed.     Assessment & Plan  Problem List Items Addressed This Visit    None    Visit Diagnoses    Establishing care with new doctor, encounter for    -  Primary   BRBPR (bright red blood per rectum)       Relevant Orders   CBC with Differential/Platelet   Ambulatory referral to Gastroenterology   POCT occult blood stool (Completed)   Generalized abdominal pain       Relevant Orders   Ambulatory referral to Gastroenterology   POCT occult blood stool (Completed)      No orders of the defined types were placed in this encounter. I spent 45 minutes with this patient, More than 50% of that time was spent in face to face education, counseling and care coordination.    Dr. [facility]  01/24/18

## 2018-01-24 NOTE — Patient Instructions (Signed)
Lower Gastrointestinal Bleeding Lower gastrointestinal (GI) bleeding is the result of bleeding from the colon, rectum, or anal area. The colon is the last part of the digestive tract, where stool, also called feces, is formed. If you have lower GI bleeding, you may see blood in or on your stool. It may be bright red. Lower GI bleeding often stops without treatment. Continued or heavy bleeding needs emergency treatment at the hospital. What are the causes? Lower GI bleeding may be caused by:  A condition that causes pouches to form in the colon over time (diverticulosis).  Swelling and irritation (inflammation) in areas with diverticulosis (diverticulitis).  Inflammation of the colon (inflammatory bowel disease).  Swollen veins in the rectum (hemorrhoids).  Painful tears in the anus (anal fissures), often caused by passing hard stools.  Cancer of the colon or rectum.  Noncancerous growths (polyps) of the colon or rectum.  A bleeding disorder that impairs the formation of blood clots and causes easy bleeding (coagulopathy).  An abnormal weakening of a blood vessel where an artery and a vein come together (arteriovenous malformation).  What increases the risk? You are more likely to develop this condition if:  You are older than 50 years of age.  You take aspirin or NSAIDs on a regular basis.  You take anticoagulant or antiplatelet drugs.  You have a history of high-dose X-ray treatment (radiation therapy) of the colon.  You recently had a colon polyp removed.  What are the signs or symptoms? Symptoms of this condition include:  Bright red blood or blood clots coming from your rectum.  Bloody stools.  Black or maroon-colored stools.  Pain or cramping in the abdomen.  Weakness or dizziness.  Racing heartbeat.  How is this diagnosed? This condition may be diagnosed based on:  Your symptoms and medical history.  A physical exam. During the exam, your health care  provider will check for signs of blood loss, such as low blood pressure and a rapid pulse.  Tests, such as: ? Flexible sigmoidoscopy. In this procedure, a flexible tube with a camera on the end is used to examine your anus and the first part of your colon to look for the source of bleeding. ? Colonoscopy. This is similar to a flexible sigmoidoscopy, but the camera can extend all the way to the uppermost part of your colon. ? Blood tests to measure your red blood cell count and to check for coagulopathy. ? An imaging study of your colon to look for a bleeding site. In some cases, you may have X-rays taken after a dye or radioactive substance is injected into your bloodstream (angiogram).  How is this treated? Treatment for this condition depends on the cause of the bleeding. Heavy or persistent bleeding is treated at the hospital. Treatment may include:  Getting fluids through an IV tube inserted into one of your veins.  Getting blood through an IV tube (blood transfusion).  Stopping bleeding through high-heat coagulation, injections of certain medicines, or applying surgical clips. This can all be done during a colonoscopy.  Having a procedure that involves first doing an angiogram and then blocking blood flow to the bleeding site (embolization).  Stopping some of your regular medicines for a certain amount of time.  Having surgery to remove part of the colon. This may be needed if bleeding is severe and does not respond to other treatment.  Follow these instructions at home:  Take over-the-counter and prescription medicines only as told by your health care provider.   You may need to avoid aspirin, NSAIDs, or other medicines that increase bleeding.  Eat foods that are high in fiber. This will help keep your stools soft. These foods include whole grains, legumes, fruits, and vegetables. Eating 1-3 prunes each day works well for many people.  Drink enough fluid to keep your urine clear or  pale yellow.  Keep all follow-up visits as told by your health care provider. This is important. Contact a health care provider if:  Your symptoms do not improve. Get help right away if:  Your bleeding increases.  You feel light-headed or you faint.  You feel weak.  You have severe cramps in your back or abdomen.  You pass large blood clots in your stool.  Your symptoms get worse. This information is not intended to replace advice given to you by your health care provider. Make sure you discuss any questions you have with your health care provider. Document Released: 02/23/2016 Document Revised: 03/15/2016 Document Reviewed: 02/23/2016 Elsevier Interactive Patient Education  2018 [employer] Inc[employer]  

## 2018-01-25 LAB — CBC WITH DIFFERENTIAL/PLATELET
Basophils Absolute: 0 10*3/uL (ref 0.0–0.2)
Basos: 0 %
EOS (ABSOLUTE): 0.2 10*3/uL (ref 0.0–0.4)
Eos: 2 %
Hematocrit: 29.1 % — ABNORMAL LOW (ref 34.0–46.6)
Hemoglobin: 9.7 g/dL — ABNORMAL LOW (ref 11.1–15.9)
Immature Grans (Abs): 0.1 10*3/uL (ref 0.0–0.1)
Immature Granulocytes: 1 %
LYMPHS ABS: 3.3 10*3/uL — AB (ref 0.7–3.1)
LYMPHS: 33 %
MCH: 31.9 pg (ref 26.6–33.0)
MCHC: 33.3 g/dL (ref 31.5–35.7)
MCV: 96 fL (ref 79–97)
Monocytes Absolute: 0.5 10*3/uL (ref 0.1–0.9)
Monocytes: 5 %
NEUTROS ABS: 6.1 10*3/uL (ref 1.4–7.0)
Neutrophils: 59 %
PLATELETS: 363 10*3/uL (ref 150–379)
RBC: 3.04 x10E6/uL — AB (ref 3.77–5.28)
RDW: 14.4 % (ref 12.3–15.4)
WBC: 10.1 10*3/uL (ref 3.4–10.8)

## 2018-01-28 ENCOUNTER — Ambulatory Visit: Payer: BLUE CROSS/BLUE SHIELD | Admitting: Gastroenterology

## 2018-01-28 ENCOUNTER — Encounter: Payer: Self-pay | Admitting: Gastroenterology

## 2018-01-28 ENCOUNTER — Other Ambulatory Visit
Admission: RE | Admit: 2018-01-28 | Discharge: 2018-01-28 | Disposition: A | Discharge status: Home / Self Care | Payer: BLUE CROSS/BLUE SHIELD | Source: Ambulatory Visit | Attending: Gastroenterology | Admitting: Gastroenterology

## 2018-01-28 VITALS — BP 133/92 | HR 86 | Ht 64.0 in | Wt 147.4 lb

## 2018-01-28 DIAGNOSIS — K625 Hemorrhage of anus and rectum: Secondary | ICD-10-CM | POA: Diagnosis not present

## 2018-01-28 DIAGNOSIS — D649 Anemia, unspecified: Secondary | ICD-10-CM

## 2018-01-28 LAB — URINALYSIS, COMPLETE (UACMP) WITH MICROSCOPIC
Bilirubin Urine: NEGATIVE
Glucose, UA: NEGATIVE mg/dL
Hgb urine dipstick: NEGATIVE
Ketones, ur: NEGATIVE mg/dL
Nitrite: NEGATIVE
Protein, ur: NEGATIVE mg/dL
SPECIFIC GRAVITY, URINE: 1.014 (ref 1.005–1.030)
pH: 8 (ref 5.0–8.0)

## 2018-01-28 LAB — IRON AND TIBC
IRON: 39 ug/dL (ref 28–170)
Saturation Ratios: 10 % — ABNORMAL LOW (ref 10.4–31.8)
TIBC: 385 ug/dL (ref 250–450)
UIBC: 346 ug/dL

## 2018-01-28 LAB — FERRITIN: Ferritin: 17 ng/mL (ref 11–307)

## 2018-01-28 LAB — FOLATE: FOLATE: 17.1 ng/mL (ref >5.9)

## 2018-01-28 MED ORDER — PEG 3350-KCL-NA BICARB-NACL 420 G PO SOLR: 4000.0000 mL | Freq: Once | ORAL | 0 refills | Status: AC

## 2018-01-28 NOTE — Progress Notes (Signed)
Jonathon Bellows MD, MRCP(U.K) [address]  [place]  [place], [place] [address]  Main: [phone]  Fax: [phone]   Gastroenterology Consultation  Referring Provider:     Juline Patch, MD Primary Care Physician:  Juline Patch, MD Primary Gastroenterologist:  Dr. Jonathon Bellows  Reason for Consultation:     Rectal bleeding and abdominal pain         HPI:   Shannon Patterson is a 52 y.o. y/o female referred for consultation & management  by Dr. Ronnald Ramp, Iven Finn, MD.    She has been referred for rectal bleeding and abdominal pain .    CBC 01/2018 Hb 9.7 with MCV 96, 2 years back Hb 16.9/    Rectal bleeding :  Onset and where was blood seen  :2 weeks back, always had issues with bleeding and hemorroids, recently had more bleeding , clots , bright red "can hear it pouring "not vaginal  Frequency of bowel movements :once a day , bleeding occurs only with a bowel movement  Consistency : loose and is baseline  Change in shape of stool:: none  Pain associated with bowel movements:none  Blood thinner usage:none  NSAID's: none  Prior colonoscopy :never  Family history of colon cancer or polyps:father had colon polyps  Weight loss: no   No rectal or anal trauma.   No vaginal bleeding , no nose bleeds, no blood in the urine.   Denies any abdominal pain presently. 6 months back did have some abdominal pain .     Past Medical History:  Diagnosis Date  . Anxiety   . History of Papanicolaou smear of cervix 08/01/2012   -/-  . Hyperlipidemia   . Hyperthyroidism   . SVT (supraventricular tachycardia) ([place])   . VBAC (vaginal birth after Cesarean) 1996    Past Surgical History:  Procedure Laterality Date  . ABDOMINAL HYSTERECTOMY  ~2000   HAS OVARIES/CX; PJR  . CESAREAN SECTION  1991   FTP; PJR  . 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 HYSTEROSALPINGOGRAM  02/04/2009  . THYROIDECTOMY     PT WAS IN HER 20s    Prior to Admission  medications   Medication Sig Start Date End Date Taking? Authorizing Provider  aspirin EC 81 MG tablet Take by mouth. 04/10/17 04/10/18 Yes [provider]  estradiol (MINIVELLE) 0.0375 MG/24HR Place 1 patch onto the skin 2 (two) times a week. 1/69/67  Yes Copland, Deirdre Evener, PA-C  levothyroxine (SYNTHROID) 100 MCG tablet Take 100 mcg by mouth daily. Morayarti 09/09/15  Yes [provider]  LORazepam (ATIVAN) 0.5 MG tablet Take 0.5 mg by mouth every 8 (eight) hours. Morayarti   Yes [provider]  metoprolol succinate (TOPROL-XL) 50 MG 24 hr tablet Take 50 mg by mouth daily. Take with or immediately following a meal.   Yes [provider]  rosuvastatin (CRESTOR) 10 MG tablet Take 10 mg by mouth daily. 09/27/15  Yes [provider]  triamcinolone (KENALOG) 0.025 % ointment Apply 1 application topically 2 (two) times daily. 12/07/15  Yes Norval Gable, MD  flecainide (TAMBOCOR) 50 MG tablet Take 50 mg by mouth 2 (two) times daily. 09/28/15 01/24/18  [provider]    Family History  Problem Relation Age of Onset  . Breast cancer Mother 65  . Cancer Mother   . Hypertension Mother   . Colon polyps Father   . Breast cancer Maternal Grandmother 80  .  Cancer Maternal Grandmother      Social History   Tobacco Use  . Smoking status: Current Every Day Smoker    Packs/day: 1.00  . Smokeless tobacco: Never Used  Substance Use Topics  . Alcohol use: Yes    Alcohol/week: 3.0 oz    Types: 5 Standard drinks or equivalent per week  . Drug use: No    Allergies as of 01/28/2018 - Review Complete 01/28/2018  Allergen Reaction Noted  . Lidocaine Other (See Comments) 12/07/2015  . Prednisone  08/24/2014  . Procaine  01/24/2015    Review of Systems:    All systems reviewed and negative except where noted in HPI.   Physical Exam:  BP (!) 133/92 (BP Location: Left Arm, Patient Position: Sitting, Cuff Size: Normal)   Pulse 86   Ht 5\' 4"  (1.626 m)    Wt 147 lb 6.4 oz (66.9 kg)   LMP  (LMP Unknown)   BMI 25.30 kg/m  No LMP recorded (lmp unknown). Patient has had a hysterectomy. Psych:  Alert and cooperative. Normal mood and affect. General:   Alert,  Well-developed, well-nourished, pleasant and cooperative in NAD Head:  Normocephalic and atraumatic. Eyes:  Sclera clear, no icterus.   Conjunctiva pink. Ears:  Normal auditory acuity. Nose:  No deformity, discharge, or lesions. Mouth:  No deformity or lesions,oropharynx pink & moist. Neck:  Supple; no masses or thyromegaly. Lungs:  Respirations even and unlabored.  Clear throughout to auscultation.   No wheezes, crackles, or rhonchi. No acute distress. Heart:  Regular rate and rhythm; no murmurs, clicks, rubs, or gallops. Abdomen:  Normal bowel sounds.  No bruits.  Soft, non-tender and non-distended without masses, hepatosplenomegaly or hernias noted.  No guarding or rebound tenderness.    Msk:  Symmetrical without gross deformities. Good, equal movement & strength bilaterally. Pulses:  Normal pulses noted. Extremities:  No clubbing or edema.  No cyanosis. Neurologic:  Alert and oriented x3;  grossly normal neurologically. Skin:  Intact without significant lesions or rashes. No jaundice. Lymph Nodes:  No significant cervical adenopathy. Psych:  Alert and cooperative. Normal mood and affect.  Imaging Studies: No results found.  Assessment and Plan:   Shannon Patterson is a 52 y.o. y/o female has been referred for rectal bleeding , also found to have a normocytic anemia .   Plan  1. Iron studies , b12.,folate, urine analysis 2. EGD+ colonoscopy - if negative to consider capsule study of the small bowel    I have discussed alternative options, risks & benefits,  which include, but are not limited to, bleeding, infection, perforation,respiratory complication & drug reaction.  The patient agrees with this plan & written consent will be obtained  Follow up in 6 weeks   Dr Jonathon Bellows MD,MRCP(U.K) .

## 2018-01-28 NOTE — Addendum Note (Signed)
Addended by: Peggye Ley on: 01/28/2018 03:32 PM   Modules accepted: Orders, SmartSet

## 2018-01-29 ENCOUNTER — Encounter: Payer: Self-pay | Admitting: *Deleted

## 2018-01-30 ENCOUNTER — Encounter
Admission: RE | Disposition: A | Discharge status: Home / Self Care | Payer: Self-pay | Source: Ambulatory Visit | Attending: Gastroenterology

## 2018-01-30 ENCOUNTER — Telehealth: Payer: Self-pay | Admitting: Gastroenterology

## 2018-01-30 ENCOUNTER — Ambulatory Visit: Payer: BLUE CROSS/BLUE SHIELD | Admitting: Anesthesiology

## 2018-01-30 ENCOUNTER — Ambulatory Visit
Admission: RE | Admit: 2018-01-30 | Discharge: 2018-01-30 | Disposition: A | Discharge status: Home / Self Care | Payer: BLUE CROSS/BLUE SHIELD | Source: Ambulatory Visit | Attending: Gastroenterology | Admitting: Gastroenterology

## 2018-01-30 ENCOUNTER — Encounter: Payer: Self-pay | Admitting: *Deleted

## 2018-01-30 DIAGNOSIS — D649 Anemia, unspecified: Secondary | ICD-10-CM

## 2018-01-30 DIAGNOSIS — E785 Hyperlipidemia, unspecified: Secondary | ICD-10-CM | POA: Diagnosis not present

## 2018-01-30 DIAGNOSIS — K640 First degree hemorrhoids: Secondary | ICD-10-CM | POA: Insufficient documentation

## 2018-01-30 DIAGNOSIS — F17210 Nicotine dependence, cigarettes, uncomplicated: Secondary | ICD-10-CM | POA: Insufficient documentation

## 2018-01-30 DIAGNOSIS — D123 Benign neoplasm of transverse colon: Secondary | ICD-10-CM | POA: Diagnosis not present

## 2018-01-30 DIAGNOSIS — I471 Supraventricular tachycardia: Secondary | ICD-10-CM | POA: Insufficient documentation

## 2018-01-30 DIAGNOSIS — D120 Benign neoplasm of cecum: Secondary | ICD-10-CM | POA: Insufficient documentation

## 2018-01-30 DIAGNOSIS — D509 Iron deficiency anemia, unspecified: Secondary | ICD-10-CM | POA: Insufficient documentation

## 2018-01-30 DIAGNOSIS — Z884 Allergy status to anesthetic agent status: Secondary | ICD-10-CM | POA: Diagnosis not present

## 2018-01-30 DIAGNOSIS — F419 Anxiety disorder, unspecified: Secondary | ICD-10-CM | POA: Diagnosis not present

## 2018-01-30 DIAGNOSIS — Z7982 Long term (current) use of aspirin: Secondary | ICD-10-CM | POA: Insufficient documentation

## 2018-01-30 DIAGNOSIS — Z888 Allergy status to other drugs, medicaments and biological substances status: Secondary | ICD-10-CM | POA: Insufficient documentation

## 2018-01-30 DIAGNOSIS — Z79899 Other long term (current) drug therapy: Secondary | ICD-10-CM | POA: Diagnosis not present

## 2018-01-30 DIAGNOSIS — K625 Hemorrhage of anus and rectum: Secondary | ICD-10-CM | POA: Diagnosis not present

## 2018-01-30 DIAGNOSIS — E039 Hypothyroidism, unspecified: Secondary | ICD-10-CM | POA: Diagnosis not present

## 2018-01-30 HISTORY — PX: COLONOSCOPY WITH PROPOFOL: SHX5780

## 2018-01-30 HISTORY — PX: ESOPHAGOGASTRODUODENOSCOPY (EGD) WITH PROPOFOL: SHX5813

## 2018-01-30 LAB — VITAMIN B12: VITAMIN B 12: 282 pg/mL (ref 180–914)

## 2018-01-30 SURGERY — ESOPHAGOGASTRODUODENOSCOPY (EGD) WITH PROPOFOL
Anesthesia: General

## 2018-01-30 MED ORDER — FERROUS SULFATE 325 (65 FE) MG PO TBEC: 325.0000 mg | DELAYED_RELEASE_TABLET | Freq: Two times a day (BID) | ORAL | 3 refills | Status: DC

## 2018-01-30 MED ORDER — PROPOFOL 10 MG/ML IV BOLUS
INTRAVENOUS | Status: DC | PRN
  Administered 2018-01-30 (×2): 50 mg via INTRAVENOUS

## 2018-01-30 MED ORDER — SODIUM CHLORIDE 0.9 % IJ SOLN
INTRAMUSCULAR | Status: DC | PRN
  Administered 2018-01-30: 10 mL via INTRAVENOUS

## 2018-01-30 MED ORDER — GLYCOPYRROLATE 0.2 MG/ML IJ SOLN
INTRAMUSCULAR | Status: AC
  Filled 2018-01-30: qty 1

## 2018-01-30 MED ORDER — POLYETHYLENE GLYCOL 3350 17 G PO PACK: 17.0000 g | PACK | Freq: Every day | ORAL | 0 refills | Status: AC

## 2018-01-30 MED ORDER — PROPOFOL 500 MG/50ML IV EMUL
INTRAVENOUS | Status: DC | PRN
  Administered 2018-01-30: 125 ug/kg/min via INTRAVENOUS

## 2018-01-30 MED ORDER — SODIUM CHLORIDE 0.9 % IV SOLN
INTRAVENOUS | Status: DC
  Administered 2018-01-30: 10:00:00 via INTRAVENOUS

## 2018-01-30 MED ORDER — LACTATED RINGERS IV SOLN
INTRAVENOUS | Status: DC | PRN
  Administered 2018-01-30: 11:00:00 via INTRAVENOUS

## 2018-01-30 MED ORDER — GLYCOPYRROLATE 0.2 MG/ML IJ SOLN
INTRAMUSCULAR | Status: DC | PRN
  Administered 2018-01-30: 0.2 mg via INTRAVENOUS

## 2018-01-30 MED ORDER — PROPOFOL 500 MG/50ML IV EMUL
INTRAVENOUS | Status: AC
  Filled 2018-01-30: qty 50

## 2018-01-30 NOTE — H&P (Signed)
Jonathon Bellows, MD [address], [place], [place], [place], [address] [address], [place], [place], [place], [address] Phone: [phone]  Fax: [phone]  Primary Care Physician:  Juline Patch, MD   Pre-Procedure History & Physical: HPI:  Shannon Patterson is a 52 y.o. female is here for an endoscopy and colonoscopy    Past Medical History:  Diagnosis Date  . Anxiety   . History of Papanicolaou smear of cervix 08/01/2012   -/-  . Hyperlipidemia   . Hyperthyroidism   . SVT (supraventricular tachycardia) ([place])   . VBAC (vaginal birth after Cesarean) 1996    Past Surgical History:  Procedure Laterality Date  . ABDOMINAL HYSTERECTOMY  ~2000   HAS OVARIES/CX; PJR  . CESAREAN SECTION  1991   FTP; PJR  . 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 HYSTEROSALPINGOGRAM  02/04/2009  . THYROIDECTOMY     PT WAS IN HER 20s    Prior to Admission medications   Medication Sig Start Date End Date Taking? Authorizing Provider  estradiol (MINIVELLE) 0.0375 MG/24HR Place 1 patch onto the skin 2 (two) times a week. 02/02/23  Yes Copland, Deirdre Evener, PA-C  levothyroxine (SYNTHROID) 100 MCG tablet Take 100 mcg by mouth daily. Morayarti 09/09/15  Yes [provider]  metoprolol succinate (TOPROL-XL) 50 MG 24 hr tablet Take 50 mg by mouth daily. Take with or immediately following a meal.   Yes [provider]  rosuvastatin (CRESTOR) 10 MG tablet Take 10 mg by mouth daily. 09/27/15  Yes [provider]  aspirin EC 81 MG tablet Take by mouth. 04/10/17 04/10/18  [provider]  LORazepam (ATIVAN) 0.5 MG tablet Take 0.5 mg by mouth every 8 (eight) hours. Morayarti    [provider]  triamcinolone (KENALOG) 0.025 % ointment Apply 1 application topically 2 (two) times daily. 12/07/15   Norval Gable, MD  flecainide (TAMBOCOR) 50 MG tablet Take 50 mg by mouth 2 (two) times daily. 09/28/15 01/24/18  [provider]     Allergies as of 01/28/2018 - Review Complete 01/28/2018  Allergen Reaction Noted  . Lidocaine Other (See Comments) 12/07/2015  . Prednisone  08/24/2014  . Procaine  01/24/2015    Family History  Problem Relation Age of Onset  . Breast cancer Mother 55  . Cancer Mother   . Hypertension Mother   . Colon polyps Father   . Breast cancer Maternal Grandmother 80  . Cancer Maternal Grandmother     Social History   Socioeconomic History  . Marital status: Married    Spouse name: Not on file  . Number of children: Not on file  . Years of education: Not on file  . Highest education level: Not on file  Occupational History  . Not on file  Social Needs  . Financial resource strain: Not on file  . Food insecurity:    Worry: Not on file    Inability: Not on file  . Transportation needs:    Medical: Not on file    Non-medical: Not on file  Tobacco Use  . Smoking status: Current Every Day Smoker    Packs/day: 1.00  . Smokeless tobacco: Never Used  Substance and Sexual Activity  . Alcohol use: Yes    Alcohol/week: 3.0 oz    Types: 5 Standard drinks or equivalent per week  . Drug use: No  . Sexual activity: Yes    Birth control/protection: Surgical  Lifestyle  .  Physical activity:    Days per week: Not on file    Minutes per session: Not on file  . Stress: Not on file  Relationships  . Social connections:    Talks on phone: Not on file    Gets together: Not on file    Attends religious service: Not on file    Active member of club or organization: Not on file    Attends meetings of clubs or organizations: Not on file    Relationship status: Not on file  . Intimate partner violence:    Fear of current or ex partner: Not on file    Emotionally abused: Not on file    Physically abused: Not on file    Forced sexual activity: Not on file  Other Topics Concern  . Not on file  Social History Narrative  . Not on file    Review of Systems: See HPI, otherwise negative  ROS  Physical Exam: BP (!) 149/105   Pulse 99   Temp 97.9 F (36.6 C) (Tympanic)   Resp 18   Ht 5\' 4"  (1.626 m)   Wt 147 lb (66.7 kg)   LMP  (LMP Unknown)   SpO2 100%   BMI 25.23 kg/m  General:   Alert,  pleasant and cooperative in NAD Head:  Normocephalic and atraumatic. Neck:  Supple; no masses or thyromegaly. Lungs:  Clear throughout to auscultation, normal respiratory effort.    Heart:  +S1, +S2, Regular rate and rhythm, No edema. Abdomen:  Soft, nontender and nondistended. Normal bowel sounds, without guarding, and without rebound.   Neurologic:  Alert and  oriented x4;  grossly normal neurologically.  Impression/Plan: [name] is here for an endoscopy and colonoscopy  to be performed for  evaluation of iron deficiency anemia     Risks, benefits, limitations, and alternatives regarding endoscopy have been reviewed with the patient.  Questions have been answered.  All parties agreeable.   Jonathon Bellows, MD  01/30/2018, 10:48 AM

## 2018-01-30 NOTE — Anesthesia Preprocedure Evaluation (Signed)
Anesthesia Evaluation  Patient identified by MRN, date of birth, ID band Patient awake    Reviewed: Allergy & Precautions, NPO status , Patient's Chart, lab work & pertinent test results, reviewed documented beta blocker date and time   Airway Mallampati: II  TM Distance: >3 FB     Dental  (+) Chipped   Pulmonary Current Smoker,           Cardiovascular      Neuro/Psych Anxiety    GI/Hepatic   Endo/Other  Hypothyroidism   Renal/GU      Musculoskeletal   Abdominal   Peds  Hematology   Anesthesia Other Findings Smokes. B -blocker for SVT.  Reproductive/Obstetrics                             Anesthesia Physical Anesthesia Plan  ASA: III  Anesthesia Plan: General   Post-op Pain Management:    Induction: Intravenous  PONV Risk Score and Plan:   Airway Management Planned:   Additional Equipment:   Intra-op Plan:   Post-operative Plan:   Informed Consent: I have reviewed the patients History and Physical, chart, labs and discussed the procedure including the risks, benefits and alternatives for the proposed anesthesia with the patient or authorized representative who has indicated his/her understanding and acceptance.     Plan Discussed with: CRNA  Anesthesia Plan Comments:         Anesthesia Quick Evaluation

## 2018-01-30 NOTE — Anesthesia Postprocedure Evaluation (Signed)
Anesthesia Post Note  Patient: Shannon Patterson  Procedure(s) Performed: ESOPHAGOGASTRODUODENOSCOPY (EGD) WITH PROPOFOL (N/A ) COLONOSCOPY WITH PROPOFOL (N/A )  Patient location during evaluation: Endoscopy Anesthesia Type: General Level of consciousness: awake and alert Pain management: pain level controlled Vital Signs Assessment: post-procedure vital signs reviewed and stable Respiratory status: spontaneous breathing, nonlabored ventilation, respiratory function stable and patient connected to nasal cannula oxygen Cardiovascular status: blood pressure returned to baseline and stable Postop Assessment: no apparent nausea or vomiting Anesthetic complications: no     Last Vitals:  Vitals:   01/30/18 1141 01/30/18 1151  BP: 128/89 (!) 132/94  Pulse: 96 97  Resp: (!) 23 15  Temp:    SpO2: 100% 100%    Last Pain:  Vitals:   01/30/18 1151  TempSrc:   PainSc: 0-No pain                 [name]

## 2018-01-30 NOTE — Op Note (Signed)
[facility] Gastroenterology Patient Name: Shannon Patterson Procedure Date: 01/30/2018 10:50 AM MRN: [id number] Account #: [id number] Date of Birth: July 21, 1966 Admit Type: Outpatient Age: 52 Room: [facility] ENDO ROOM 4 Gender: Female Note Status: Finalized Procedure:            Upper GI endoscopy Indications:          Iron deficiency anemia Providers:            Jonathon Bellows MD, MD Referring MD:         Juline Patch, MD (Referring MD) Medicines:            Monitored Anesthesia Care Complications:        No immediate complications. Procedure:            Pre-Anesthesia Assessment:                       - Prior to the procedure, a History and Physical was                        performed, and patient medications, allergies and                        sensitivities were reviewed. The patient's tolerance of                        previous anesthesia was reviewed.                       - The risks and benefits of the procedure and the                        sedation options and risks were discussed with the                        patient. All questions were answered and informed                        consent was obtained.                       - ASA Grade Assessment: II - A patient with mild                        systemic disease.                       After obtaining informed consent, the endoscope was                        passed under direct vision. Throughout the procedure,                        the patient's blood pressure, pulse, and oxygen                        saturations were monitored continuously. The Endoscope                        was introduced through the mouth, and advanced to the  third part of duodenum. The upper GI endoscopy was                        accomplished with ease. The patient tolerated the                        procedure well. Findings:      The stomach was normal.      The esophagus was normal.      The examined  duodenum was normal. Biopsies for histology were taken with       a cold forceps for evaluation of celiac disease.      The cardia and gastric fundus were normal on retroflexion. Impression:           - Normal stomach.                       - Normal esophagus.                       - Normal examined duodenum. Biopsied. Recommendation:       - Await pathology results.                       - Perform a colonoscopy today. Procedure Code(s):    --- Professional ---                       [phone], Esophagogastroduodenoscopy, flexible, transoral;                        with biopsy, single or multiple Diagnosis Code(s):    --- Professional ---                       D50.9, Iron deficiency anemia, unspecified CPT copyright 2017 American Medical Association. All rights reserved. The codes documented in this report are preliminary and upon coder review may  be revised to meet current compliance requirements. Jonathon Bellows, MD Jonathon Bellows MD, MD 01/30/2018 11:04:46 AM This report has been signed electronically. Number of Addenda: 0 Note Initiated On: 01/30/2018 10:50 AM      [facility]

## 2018-01-30 NOTE — Anesthesia Post-op Follow-up Note (Signed)
Anesthesia QCDR form completed.        

## 2018-01-30 NOTE — Transfer of Care (Signed)
Immediate Anesthesia Transfer of Care Note  Patient: Shannon Patterson  Procedure(s) Performed: ESOPHAGOGASTRODUODENOSCOPY (EGD) WITH PROPOFOL (N/A ) COLONOSCOPY WITH PROPOFOL (N/A )  Patient Location: PACU and Endoscopy Unit  Anesthesia Type:General  Level of Consciousness: awake, alert  and oriented  Airway & Oxygen Therapy: Patient Spontanous Breathing  Post-op Assessment: Report given to RN and Post -op Vital signs reviewed and stable  Post vital signs: Reviewed and stable  Last Vitals:  Vitals Value Taken Time  BP    Temp    Pulse 103 01/30/2018 11:29 AM  Resp 14 01/30/2018 11:29 AM  SpO2 100 % 01/30/2018 11:29 AM  Vitals shown include unvalidated device data.  Last Pain:  Vitals:   01/30/18 1009  TempSrc: Tympanic  PainSc: 0-No pain         Complications: No apparent anesthesia complications

## 2018-01-30 NOTE — Telephone Encounter (Signed)
Left vm to set up Fu with Dr. Marius Ditch per Sharyn Lull

## 2018-01-30 NOTE — Op Note (Signed)
[facility] Gastroenterology Patient Name: Shannon Patterson Procedure Date: 01/30/2018 10:49 AM MRN: [id number] Account #: [id number] Date of Birth: 1965/11/03 Admit Type: Outpatient Age: 52 Room: [facility] ENDO ROOM 4 Gender: Female Note Status: Finalized Procedure:            Colonoscopy Indications:          Iron deficiency anemia Providers:            Jonathon Bellows MD, MD Referring MD:         Juline Patch, MD (Referring MD) Medicines:            Monitored Anesthesia Care Complications:        No immediate complications. Procedure:            Pre-Anesthesia Assessment:                       - Prior to the procedure, a History and Physical was                        performed, and patient medications, allergies and                        sensitivities were reviewed. The patient's tolerance of                        previous anesthesia was reviewed.                       - The risks and benefits of the procedure and the                        sedation options and risks were discussed with the                        patient. All questions were answered and informed                        consent was obtained.                       - ASA Grade Assessment: II - A patient with mild                        systemic disease.                       After obtaining informed consent, the colonoscope was                        passed under direct vision. Throughout the procedure,                        the patient's blood pressure, pulse, and oxygen                        saturations were monitored continuously. The                        Colonoscope was introduced through the anus and  advanced to the the cecum, identified by the                        appendiceal orifice, IC valve and transillumination.                        The colonoscopy was performed with ease. The patient                        tolerated the procedure well. The quality of the bowel                         preparation was good. Findings:      The perianal and digital rectal examinations were normal.      Two sessile polyps were found in the transverse colon and cecum. The       polyps were 2 to 3 mm in size. These polyps were removed with a cold       biopsy forceps. Resection and retrieval were complete.      A 12 mm polyp was found in the hepatic flexure. The polyp was sessile.       Preparations were made for mucosal resection. 10 mL of saline was       injected with adequate lift of the lesion from the muscularis propria.       Snare mucosal resection with suction (via the working channel) retrieval       was performed. A 14 mm area was resected. Resection and retrieval were       complete. There was no bleeding during, and at the end, of the       procedure. To prevent bleeding after mucosal resection, one hemostatic       clip was successfully placed. There was no bleeding during, or at the       end, of the procedure.      Non-bleeding internal hemorrhoids were found during retroflexion. The       hemorrhoids were large and Grade I (internal hemorrhoids that do not       prolapse).      The exam was otherwise without abnormality. Impression:           - Two 2 to 3 mm polyps in the transverse colon and in                        the cecum, removed with a cold biopsy forceps. Resected                        and retrieved.                       - One 12 mm polyp at the hepatic flexure, removed with                        mucosal resection. Resected and retrieved. Clip was                        placed.                       - Non-bleeding internal hemorrhoids.                       -  The examination was otherwise normal.                       - Mucosal resection was performed. Resection and                        retrieval were complete. Recommendation:       - Discharge patient to home (with escort).                       - Resume previous diet.                        - Continue present medications.                       - Await pathology results.                       - Repeat colonoscopy in 3 - 5 years for surveillance                        based on pathology results.                       - 1. Iwill refer to Dr Marius Ditch for banding of hemorroids.                       2. Suggest stool softners                       3. Commence on Ferrous sulphate 325 mg once a day Procedure Code(s):    --- Professional ---                       [phone], Colonoscopy, flexible; with endoscopic mucosal                        resection                       [id number], 26, Colonoscopy, flexible; with biopsy, single                        or multiple Diagnosis Code(s):    --- Professional ---                       D12.3, Benign neoplasm of transverse colon (hepatic                        flexure or splenic flexure)                       D12.0, Benign neoplasm of cecum                       K64.0, First degree hemorrhoids                       D50.9, Iron deficiency anemia, unspecified CPT copyright 2017 American Medical Association. All rights reserved. The codes documented in this report are preliminary and upon coder review may  be revised to meet current compliance requirements. Jonathon Bellows, MD Jonathon Bellows MD, MD 01/30/2018 11:30:04  AM This report has been signed electronically. Number of Addenda: 0 Note Initiated On: 01/30/2018 10:49 AM Scope Withdrawal Time: 0 hours 15 minutes 53 seconds  Total Procedure Duration: 0 hours 18 minutes 26 seconds       [facility]

## 2018-01-31 ENCOUNTER — Encounter: Payer: Self-pay | Admitting: Gastroenterology

## 2018-01-31 ENCOUNTER — Telehealth: Payer: Self-pay | Admitting: Gastroenterology

## 2018-01-31 LAB — SURGICAL PATHOLOGY

## 2018-01-31 NOTE — Telephone Encounter (Signed)
Patient complaining of headache since procedure (0-10 its a 7). Also does he want her to take something for anemia.

## 2018-02-19 ENCOUNTER — Telehealth: Payer: Self-pay

## 2018-02-19 NOTE — Telephone Encounter (Signed)
Advised patient of results per Dr. Vicente Males:  inform iron levels low - suggest commence on ferrous sulphate 325 mg BID   Ms. Bitterman has the ferrous sulphate and has started taking it per this call.

## 2018-02-21 ENCOUNTER — Ambulatory Visit: Payer: BLUE CROSS/BLUE SHIELD | Admitting: Gastroenterology

## 2018-02-21 ENCOUNTER — Encounter: Payer: Self-pay | Admitting: Gastroenterology

## 2018-02-21 VITALS — BP 190/96 | HR 85 | Ht 64.0 in | Wt 148.0 lb

## 2018-02-21 DIAGNOSIS — K648 Other hemorrhoids: Secondary | ICD-10-CM

## 2018-02-21 DIAGNOSIS — D500 Iron deficiency anemia secondary to blood loss (chronic): Secondary | ICD-10-CM | POA: Diagnosis not present

## 2018-02-21 DIAGNOSIS — K641 Second degree hemorrhoids: Secondary | ICD-10-CM | POA: Diagnosis not present

## 2018-02-21 HISTORY — DX: Other hemorrhoids: K64.8

## 2018-02-21 NOTE — Progress Notes (Signed)
Cephas Darby, MD [address]  [place]  [place], [place] [address]  Main: [phone]  Fax: [phone]    Gastroenterology Consultation  Referring Provider:     Juline Patch, MD Primary Care Physician:  Juline Patch, MD Primary Gastroenterologist:  Dr. Jonathon Bellows Reason for Consultation:     Rectal bleeding        HPI:   Shannon Patterson is a 52 y.o. female referred by Dr. Juline Patch, MD  for consultation & management of rectal bleeding. Patient has several years history of intermittent rectal bleeding, rectal pain and pressure, burning, perianal itching, prolapse. She reports that ver since her child was born who is now 66 years old, she has been spending about 20-30 minutes on toilet every day although she denies constipation or diarrhea. She does not strain or push. She has a constant feeling of incomplete emptying which forces her to spend several minutes on toilet. She feels that her anal sphincter remains wide open after the bowel movement and has to take extra effort to voluntarily squeeze her perianal area in order to close the sphincter.   She has history of iron deficiency anemia, underwent EGD and colonoscopy last month, was negative for evidence of celiac disease, she had 2 polyps that were removed and large internal hemorrhoids. Therefore, she is referred here for hemorrhoid ligation  Currently, she is taking oral iron along with MiraLAX  NSAIDs: none  Antiplts/Anticoagulants/Anti thrombotics: none  GI Procedures:  EGD and colonoscopy on 01/30/2018 EGD - Normal stomach. - Normal esophagus. - Normal examined duodenum. Biopsied. Colonoscopy - Two 2 to 3 mm polyps in the transverse colon and in the cecum, removed with a cold biopsy forceps. Resected and retrieved. - One 12 mm polyp at the hepatic flexure, removed with mucosal resection. Resected and retrieved. Clip was placed. - Non-bleeding internal hemorrhoids. - The examination was  otherwise normal. DIAGNOSIS:  A. DUODENUM, RANDOM; COLD BIOPSY:  - UNREMARKABLE SMALL INTESTINAL MUCOSA.  - NEGATIVE FOR FEATURES OF CELIAC, DYSPLASIA, AND MALIGNANCY.   B. COLON POLYP, CECUM; COLD BIOPSY:  - TUBULAR ADENOMA.  - NEGATIVE FOR HIGH-GRADE DYSPLASIA AND MALIGNANCY.   C. COLON POLYP, HEPATIC FLEXURE; HOT SNARE:  - SESSILE SERRATED ADENOMA.  - NEGATIVE FOR HIGH-GRADE DYSPLASIA AND MALIGNANCY.   D. COLON POLYP, TRANSVERSE; COLD BIOPSY:  - SESSILE SERRATED ADENOMA.  - NEGATIVE FOR HIGH-GRADE DYSPLASIA AND MALIGNANCY.   Past Medical History:  Diagnosis Date  . Anxiety   . History of Papanicolaou smear of cervix 08/01/2012   -/-  . Hyperlipidemia   . Hyperthyroidism   . SVT (supraventricular tachycardia) ([place])   . VBAC (vaginal birth after Cesarean) 1996    Past Surgical History:  Procedure Laterality Date  . ABDOMINAL HYSTERECTOMY  ~2000   HAS OVARIES/CX; PJR  . CESAREAN SECTION  1991   FTP; PJR  . COLONOSCOPY WITH PROPOFOL N/A 01/30/2018   Procedure: COLONOSCOPY WITH PROPOFOL;  Surgeon: Jonathon Bellows, MD;  Location: [facility] ENDOSCOPY;  Service: Gastroenterology;  Laterality: N/A;  . 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 ESOPHAGOGASTRODUODENOSCOPY (EGD) WITH PROPOFOL N/A 01/30/2018   Procedure: ESOPHAGOGASTRODUODENOSCOPY (EGD) WITH PROPOFOL;  Surgeon: Jonathon Bellows, MD;  Location: [facility] ENDOSCOPY;  Service: Gastroenterology;  Laterality: N/A;  . HYSTEROSALPINGOGRAM  02/04/2009  . THYROIDECTOMY     PT WAS IN HER 20s    Current Outpatient Medications:  .  estradiol (MINIVELLE) 0.0375 MG/24HR, Place 1 patch onto  the skin 2 (two) times a week., Disp: 8 patch, Rfl: 11 .  ferrous sulfate 325 (65 FE) MG EC tablet, Take 1 tablet (325 mg total) by mouth 2 (two) times daily., Disp: 60 tablet, Rfl: 3 .  levothyroxine (SYNTHROID) 100 MCG tablet, Take 100 mcg by mouth daily. Morayarti, Disp: , Rfl:  .  LORazepam (ATIVAN) 0.5 MG tablet, Take 0.5  mg by mouth every 8 (eight) hours. Morayarti, Disp: , Rfl:  .  metoprolol succinate (TOPROL-XL) 50 MG 24 hr tablet, Take 50 mg by mouth daily. Take with or immediately following a meal., Disp: , Rfl:  .  polyethylene glycol (MIRALAX) packet, Take 17 g by mouth daily., Disp: 14 each, Rfl: 0 .  rosuvastatin (CRESTOR) 10 MG tablet, Take 10 mg by mouth daily., Disp: , Rfl:  .  aspirin EC 81 MG tablet, Take by mouth., Disp: , Rfl:  .  triamcinolone (KENALOG) 0.025 % ointment, Apply 1 application topically 2 (two) times daily. (Patient not taking: Reported on 02/21/2018), Disp: 30 g, Rfl: 0    Family History  Problem Relation Age of Onset  . Breast cancer Mother 53  . Cancer Mother   . Hypertension Mother   . Colon polyps Father   . Breast cancer Maternal Grandmother 80  . Cancer Maternal Grandmother      Social History   Tobacco Use  . Smoking status: Current Every Day Smoker    Packs/day: 1.00  . Smokeless tobacco: Never Used  Substance Use Topics  . Alcohol use: Yes    Alcohol/week: 3.0 oz    Types: 5 Standard drinks or equivalent per week  . Drug use: No    Allergies as of 02/21/2018 - Review Complete 02/21/2018  Allergen Reaction Noted  . Lidocaine Other (See Comments) 12/07/2015  . Prednisone  08/24/2014  . Procaine  01/24/2015    Review of Systems:    All systems reviewed and negative except where noted in HPI.   Physical Exam:  BP (!) 190/96   Pulse 85   Ht 5\' 4"  (1.626 m)   Wt 148 lb (67.1 kg)   LMP  (LMP Unknown)   BMI 25.40 kg/m  No LMP recorded (lmp unknown). Patient has had a hysterectomy.  General:   Alert,  Well-developed, well-nourished, pleasant and cooperative in NAD Head:  Normocephalic and atraumatic. Eyes:  Sclera clear, no icterus.   Conjunctiva pink. Ears:  Normal auditory acuity. Nose:  No deformity, discharge, or lesions. Mouth:  No deformity or lesions,oropharynx pink & moist. Neck:  Supple; no masses or thyromegaly. Lungs:  Respirations  even and unlabored.  Clear throughout to auscultation.   No wheezes, crackles, or rhonchi. No acute distress. Heart:  Regular rate and rhythm; no murmurs, clicks, rubs, or gallops. Abdomen:  Normal bowel sounds. Soft, non-tender and non-distended without masses, hepatosplenomegaly or hernias noted.  No guarding or rebound tenderness.   Rectal: mild perianal erythema. digital rectal exam revealed tenderness in the posterior anal canal Msk:  Symmetrical without gross deformities. Good, equal movement & strength bilaterally. Pulses:  Normal pulses noted. Extremities:  No clubbing or edema.  No cyanosis. Neurologic:  Alert and oriented x3;  grossly normal neurologically. Skin:  Intact without significant lesions or rashes. No jaundice. Psych:  Alert and cooperative. Normal mood and affect.  Imaging Studies: No abdominal imaging  Assessment and Plan:   Shannon Patterson is a 52 y.o. Caucasian female with iron deficiency anemia from bleeding internal hemorrhoids. Discussed with patient about  outpatient hemorrhoidal ligation and she is agreeable for the procedure  - hemorrhoid ligation today - Topical nitroglycerin 0.125% as directed - start fiber supplements - Kegel exercises, handout given  Iron deficiency anemia - Continue oral iron along with stool softener - Recheck labs during next visit   Follow up in 2 weeks for hemorrhoidal ligation   Cephas Darby, MD

## 2018-02-21 NOTE — Progress Notes (Signed)
PROCEDURE NOTE: The patient presents with symptomatic grade 2 hemorrhoids, unresponsive to maximal medical therapy, requesting rubber band ligation of his/her hemorrhoidal disease.  All risks, benefits and alternative forms of therapy were described and informed consent was obtained.  In the Left Lateral Decubitus position (if anoscopy is performed) anoscopic examination revealed grade 2 hemorrhoids in the all position(s).   The decision was made to band the LL internal hemorrhoid, and the [facility] O'Regan System was used to perform band ligation without complication.  Digital anorectal examination was then performed to assure proper positioning of the band, and to adjust the banded tissue as required.  The patient was discharged home without pain or other issues.  Dietary and behavioral recommendations were given and (if necessary - prescriptions were given), along with follow-up instructions.  The patient will return 2 weeks for follow-up and possible additional banding as required.  No complications were encountered and the patient tolerated the procedure well.  Elgie Landino R Pacen Watford, MD [address]  [address]  [place], [place] [address]  Main: [phone]  Fax: [phone] Pager: [phone]   

## 2018-03-04 ENCOUNTER — Encounter: Payer: Self-pay | Admitting: Gastroenterology

## 2018-03-04 ENCOUNTER — Ambulatory Visit: Payer: BLUE CROSS/BLUE SHIELD | Admitting: Gastroenterology

## 2018-03-04 VITALS — BP 146/94 | Ht 64.0 in | Wt 147.6 lb

## 2018-03-04 DIAGNOSIS — D500 Iron deficiency anemia secondary to blood loss (chronic): Secondary | ICD-10-CM | POA: Diagnosis not present

## 2018-03-04 NOTE — Progress Notes (Signed)
Jonathon Bellows MD, MRCP(U.K) [address]  [place]  [place], [place] [address]  Main: [phone]  Fax: [phone]   Primary Care Physician: Juline Patch, MD  Primary Gastroenterologist:  Dr. Jonathon Bellows   Chief Complaint  Patient presents with  . Follow-up    Iron Def. Anemia    HPI: Shannon Patterson is a 52 y.o. female   Summary of history :  She was initially seen 01/28/18 for rectal bleeding and was found to have a normocytic anemia. .   Interval history   01/28/2018-  03/04/2018  Ferritin was 17 - commenced on oral iron. B12 normal .  Colonoscopy demonstrated a few adenomas that were resected. Repeat in 3 years. She had large internal hemorrhoids. EGD was normal.  She underwent banding of her hemorroids by Dr Marius Ditch.   Iron/TIBC/Ferritin/ %Sat    Component Value Date/Time   IRON 39 01/28/2018 1536   TIBC 385 01/28/2018 1536   FERRITIN 17 01/28/2018 1536   IRONPCTSAT 10 (L) 01/28/2018 1536  no issues after banding- no rectal bleeding .     Current Outpatient Medications  Medication Sig Dispense Refill  . aspirin EC 81 MG tablet Take by mouth.    . estradiol (MINIVELLE) 0.0375 MG/24HR Place 1 patch onto the skin 2 (two) times a week. 8 patch 11  . levothyroxine (SYNTHROID) 100 MCG tablet Take 100 mcg by mouth daily. Morayarti    . LORazepam (ATIVAN) 0.5 MG tablet Take 0.5 mg by mouth every 8 (eight) hours. Morayarti    . metoprolol succinate (TOPROL-XL) 50 MG 24 hr tablet Take 50 mg by mouth daily. Take with or immediately following a meal.    . polyethylene glycol (MIRALAX) packet Take 17 g by mouth daily. 14 each 0  . rosuvastatin (CRESTOR) 10 MG tablet Take 10 mg by mouth daily.    . ferrous sulfate 325 (65 FE) MG EC tablet Take 1 tablet (325 mg total) by mouth 2 (two) times daily. (Patient not taking: Reported on 03/04/2018) 60 tablet 3  . triamcinolone (KENALOG) 0.025 % ointment Apply 1 application topically 2 (two) times daily. (Patient not taking:  Reported on 02/21/2018) 30 g 0   No current facility-administered medications for this visit.     Allergies as of 03/04/2018 - Review Complete 03/04/2018  Allergen Reaction Noted  . Lidocaine Other (See Comments) 12/07/2015  . Prednisone  08/24/2014  . Procaine  01/24/2015    ROS:  General: Negative for anorexia, weight loss, fever, chills, fatigue, weakness. ENT: Negative for hoarseness, difficulty swallowing , nasal congestion. CV: Negative for chest pain, angina, palpitations, dyspnea on exertion, peripheral edema.  Respiratory: Negative for dyspnea at rest, dyspnea on exertion, cough, sputum, wheezing.  GI: See history of present illness. GU:  Negative for dysuria, hematuria, urinary incontinence, urinary frequency, nocturnal urination.  Endo: Negative for unusual weight change.    Physical Examination:   BP (!) 146/94   Ht 5\' 4"  (1.626 m)   Wt 147 lb 9.6 oz (67 kg)   LMP  (LMP Unknown)   BMI 25.34 kg/m   General: Well-nourished, well-developed in no acute distress.  Eyes: No icterus. Conjunctivae pink. Mouth: Oropharyngeal mucosa moist and pink , no lesions erythema or exudate. Lungs: Clear to auscultation bilaterally. Non-labored. Heart: Regular rate and rhythm, no murmurs rubs or gallops.  Abdomen: Bowel sounds are normal, nontender, nondistended, no hepatosplenomegaly or masses, no abdominal bruits or hernia , no rebound or guarding.   Extremities: No  lower extremity edema. No clubbing or deformities. Neuro: Alert and oriented x 3.  Grossly intact. Skin: Warm and dry, no jaundice.   Psych: Alert and cooperative, normal mood and affect.   Imaging Studies: No results found.  Assessment and Plan:   Shannon Patterson is a 52 y.o. y/o female here to follow up to her last visit where she had rectal bleeding and a colonoscopy showed large internal hemorroids. She had them banded by Dr Marius Ditch. Presently no bleeding    Plan  1. Check CBC and iron studies in 6 weeks.  2.  F/u with Dr Marius Ditch for banding  3. I suspect her blood loss was from hemorrhoids and since has resolved expect her Hb to improve.    Dr Jonathon Bellows  MD,MRCP [facility]) Follow up in 3 months

## 2018-03-12 ENCOUNTER — Ambulatory Visit: Payer: BLUE CROSS/BLUE SHIELD | Admitting: Gastroenterology

## 2018-03-12 ENCOUNTER — Encounter: Payer: Self-pay | Admitting: Gastroenterology

## 2018-03-12 VITALS — BP 147/91 | HR 80 | Resp 17 | Ht 64.0 in | Wt 148.0 lb

## 2018-03-12 DIAGNOSIS — K641 Second degree hemorrhoids: Secondary | ICD-10-CM

## 2018-03-12 NOTE — Progress Notes (Signed)
PROCEDURE NOTE: The patient presents with symptomatic grade 2 hemorrhoids, unresponsive to maximal medical therapy, requesting rubber band ligation of his/her hemorrhoidal disease.  All risks, benefits and alternative forms of therapy were described and informed consent was obtained.  The decision was made to band the RP internal hemorrhoid, and the [facility] System was used to perform band ligation without complication.  Digital anorectal examination was then performed to assure proper positioning of the band, and to adjust the banded tissue as required.  The patient was discharged home without pain or other issues.  Dietary and behavioral recommendations were given and (if necessary - prescriptions were given), along with follow-up instructions.  The patient will return 2 weeks for follow-up and possible additional banding as required.  No complications were encountered and the patient tolerated the procedure well.  Rohini R Vanga, MD [address]  [address]  [place],  [address]  Main: [phone]  Fax: [phone] Pager: [phone]     

## 2018-03-21 ENCOUNTER — Ambulatory Visit: Payer: BLUE CROSS/BLUE SHIELD | Admitting: Gastroenterology

## 2018-03-28 ENCOUNTER — Encounter: Payer: Self-pay | Admitting: Gastroenterology

## 2018-03-28 ENCOUNTER — Ambulatory Visit: Payer: BLUE CROSS/BLUE SHIELD | Admitting: Gastroenterology

## 2018-04-28 ENCOUNTER — Other Ambulatory Visit: Payer: Self-pay | Admitting: Obstetrics and Gynecology

## 2018-04-28 ENCOUNTER — Other Ambulatory Visit: Payer: Self-pay | Admitting: Family Medicine

## 2018-04-28 DIAGNOSIS — Z1231 Encounter for screening mammogram for malignant neoplasm of breast: Secondary | ICD-10-CM

## 2018-05-06 ENCOUNTER — Ambulatory Visit: Payer: BLUE CROSS/BLUE SHIELD

## 2018-05-12 ENCOUNTER — Ambulatory Visit
Admission: RE | Admit: 2018-05-12 | Discharge: 2018-05-12 | Disposition: A | Discharge status: Home / Self Care | Payer: BLUE CROSS/BLUE SHIELD | Source: Ambulatory Visit | Attending: Family Medicine | Admitting: Family Medicine

## 2018-05-12 DIAGNOSIS — Z1231 Encounter for screening mammogram for malignant neoplasm of breast: Secondary | ICD-10-CM | POA: Insufficient documentation

## 2018-06-05 ENCOUNTER — Encounter: Payer: Self-pay | Admitting: Gastroenterology

## 2018-06-05 ENCOUNTER — Ambulatory Visit: Payer: BLUE CROSS/BLUE SHIELD | Admitting: Gastroenterology

## 2018-06-05 VITALS — BP 139/91 | HR 86 | Ht 64.0 in | Wt 149.4 lb

## 2018-06-05 DIAGNOSIS — D500 Iron deficiency anemia secondary to blood loss (chronic): Secondary | ICD-10-CM | POA: Diagnosis not present

## 2018-06-05 NOTE — Progress Notes (Signed)
Jonathon Bellows MD, MRCP(U.K) [address]  [place]  [place], [place] [address]  Main: [phone]  Fax: [phone]   Primary Care Physician: Juline Patch, MD  Primary Gastroenterologist:  Dr. Jonathon Bellows   No chief complaint on file.   HPI: Shannon Patterson is a 52 y.o. female   Summary of history :  She was initially seen in 01/2018 for rectal bleeding and abdominal pain. She had a normocytic anemia at that time. Iron % sat was low with a low ferritin of 17 , b12 normal. No blood in urine.   01/2018: EGD: normal duodenal bx, tubular adenoma of colon resected x 3 . Large internal hemorrhoids were seen in addition to polyps and referred to Dr Marius Ditch for treatment and underwent banding with resolution of rectal bleeding.     Interval history  01/2018-06/05/2018   Has a follow up procedure for banding of hemorroids, still feels like it is prolapsing , occasionally sees some bleed. She says that she didn't take her iron previously and says that she had her labs in 03/2018 and her Hb around 10 grams with low ferritin and low b12.        Current Outpatient Medications  Medication Sig Dispense Refill  . estradiol (MINIVELLE) 0.0375 MG/24HR Place 1 patch onto the skin 2 (two) times a week. 8 patch 11  . ferrous sulfate 325 (65 FE) MG EC tablet Take 1 tablet (325 mg total) by mouth 2 (two) times daily. (Patient not taking: Reported on 03/04/2018) 60 tablet 3  . levothyroxine (SYNTHROID) 100 MCG tablet Take 100 mcg by mouth daily. Morayarti    . LORazepam (ATIVAN) 0.5 MG tablet Take 0.5 mg by mouth every 8 (eight) hours. Morayarti    . metoprolol succinate (TOPROL-XL) 50 MG 24 hr tablet Take 50 mg by mouth daily. Take with or immediately following a meal.    . polyethylene glycol (MIRALAX) packet Take 17 g by mouth daily. 14 each 0  . rosuvastatin (CRESTOR) 10 MG tablet Take 10 mg by mouth daily.    Marland Kitchen triamcinolone (KENALOG) 0.025 % ointment Apply 1 application topically 2 (two)  times daily. (Patient not taking: Reported on 02/21/2018) 30 g 0   No current facility-administered medications for this visit.     Allergies as of 06/05/2018 - Review Complete 03/12/2018  Allergen Reaction Noted  . Lidocaine Other (See Comments) 12/07/2015  . Prednisone  08/24/2014  . Procaine  01/24/2015    ROS:  General: Negative for anorexia, weight loss, fever, chills, fatigue, weakness. ENT: Negative for hoarseness, difficulty swallowing , nasal congestion. CV: Negative for chest pain, angina, palpitations, dyspnea on exertion, peripheral edema.  Respiratory: Negative for dyspnea at rest, dyspnea on exertion, cough, sputum, wheezing.  GI: See history of present illness. GU:  Negative for dysuria, hematuria, urinary incontinence, urinary frequency, nocturnal urination.  Endo: Negative for unusual weight change.    Physical Examination:   LMP  (LMP Unknown)   General: Well-nourished, well-developed in no acute distress.  Eyes: No icterus. Conjunctivae pink. Mouth: Oropharyngeal mucosa moist and pink , no lesions erythema or exudate. Lungs: Clear to auscultation bilaterally. Non-labored. Heart: Regular rate and rhythm, no murmurs rubs or gallops.  Abdomen: Bowel sounds are normal, nontender, nondistended, no hepatosplenomegaly or masses, no abdominal bruits or hernia , no rebound or guarding.   Extremities: No lower extremity edema. No clubbing or deformities. Neuro: Alert and oriented x 3.  Grossly intact. Skin: Warm and dry, no jaundice.  Psych: Alert and cooperative, normal mood and affect.   Imaging Studies: Mm 3d Screen Breast Bilateral  Result Date: 05/12/2018 CLINICAL DATA:  Screening. EXAM: DIGITAL SCREENING BILATERAL MAMMOGRAM WITH TOMO AND CAD COMPARISON:  Previous exam(s). ACR Breast Density Category c: The breast tissue is heterogeneously dense, which may obscure small masses. FINDINGS: There are no findings suspicious for malignancy. Images were processed with  CAD. IMPRESSION: No mammographic evidence of malignancy. A result letter of this screening mammogram will be mailed directly to the patient. RECOMMENDATION: Screening mammogram in one year. (Code:SM-B-01Y) BI-RADS CATEGORY  1: Negative. Electronically Signed   By: Dorise Bullion III M.D   On: 05/12/2018 16:21    Assessment and Plan:   Shannon Patterson is a 52 y.o. y/o female h ere to follow up for iron deficiency anemia, rectal bleeding felt secondary to hemorrhoids which have been banded. Here today for follow up . Likely iron and B12 deficienct due to a combination of blood loss from hemorroids and poor oral absorption.   Plan  1. CBC, iron studies in 8 weeks with b12 and iron orally. She is not keen on a a capsule study at this time , if no better in 8 weeks will need IV iron , b12 shots and capsule study    Dr Jonathon Bellows  MD,MRCP [facility]) Follow up in 8 weeks

## 2018-06-13 ENCOUNTER — Emergency Department: Payer: BLUE CROSS/BLUE SHIELD

## 2018-06-13 ENCOUNTER — Emergency Department
Admission: EM | Admit: 2018-06-13 | Discharge: 2018-06-13 | Disposition: A | Discharge status: Home / Self Care | Payer: BLUE CROSS/BLUE SHIELD | Attending: Emergency Medicine | Admitting: Emergency Medicine

## 2018-06-13 ENCOUNTER — Other Ambulatory Visit: Payer: Self-pay

## 2018-06-13 DIAGNOSIS — R0789 Other chest pain: Secondary | ICD-10-CM | POA: Diagnosis present

## 2018-06-13 DIAGNOSIS — F17200 Nicotine dependence, unspecified, uncomplicated: Secondary | ICD-10-CM | POA: Diagnosis not present

## 2018-06-13 DIAGNOSIS — E039 Hypothyroidism, unspecified: Secondary | ICD-10-CM | POA: Insufficient documentation

## 2018-06-13 DIAGNOSIS — R1084 Generalized abdominal pain: Secondary | ICD-10-CM | POA: Insufficient documentation

## 2018-06-13 DIAGNOSIS — R110 Nausea: Secondary | ICD-10-CM | POA: Insufficient documentation

## 2018-06-13 DIAGNOSIS — T7840XA Allergy, unspecified, initial encounter: Secondary | ICD-10-CM | POA: Diagnosis not present

## 2018-06-13 DIAGNOSIS — Z79899 Other long term (current) drug therapy: Secondary | ICD-10-CM | POA: Diagnosis not present

## 2018-06-13 LAB — COMPREHENSIVE METABOLIC PANEL
ALBUMIN: 4.2 g/dL (ref 3.5–5.0)
ALT: 19 U/L (ref 0–44)
ANION GAP: 11 (ref 5–15)
AST: 29 U/L (ref 15–41)
Alkaline Phosphatase: 67 U/L (ref 38–126)
BILIRUBIN TOTAL: 0.9 mg/dL (ref 0.3–1.2)
BUN: 13 mg/dL (ref 6–20)
CALCIUM: 9.4 mg/dL (ref 8.9–10.3)
CO2: 25 mmol/L (ref 22–32)
Chloride: 105 mmol/L (ref 98–111)
Creatinine, Ser: 0.8 mg/dL (ref 0.44–1.00)
GFR calc Af Amer: >60 mL/min (ref >60)
GLUCOSE: 134 mg/dL — AB (ref 70–99)
Potassium: 3.3 mmol/L — ABNORMAL LOW (ref 3.5–5.1)
Sodium: 141 mmol/L (ref 135–145)
TOTAL PROTEIN: 7.3 g/dL (ref 6.5–8.1)

## 2018-06-13 LAB — CBC WITH DIFFERENTIAL/PLATELET
Basophils Absolute: 0 10*3/uL (ref 0–0.1)
Basophils Relative: 0 %
EOS PCT: 0 %
Eosinophils Absolute: 0 10*3/uL (ref 0–0.7)
HCT: 42.9 % (ref 35.0–47.0)
Hemoglobin: 14.3 g/dL (ref 12.0–16.0)
LYMPHS ABS: 4.2 10*3/uL — AB (ref 1.0–3.6)
LYMPHS PCT: 47 %
MCH: 28.8 pg (ref 26.0–34.0)
MCHC: 33.4 g/dL (ref 32.0–36.0)
MCV: 86.4 fL (ref 80.0–100.0)
MONOS PCT: 3 %
Monocytes Absolute: 0.3 10*3/uL (ref 0.2–0.9)
Neutro Abs: 4.4 10*3/uL (ref 1.4–6.5)
Neutrophils Relative %: 50 %
PLATELETS: 349 10*3/uL (ref 150–440)
RBC: 4.96 MIL/uL (ref 3.80–5.20)
RDW: 27.7 % — AB (ref 11.5–14.5)
WBC: 8.9 10*3/uL (ref 3.6–11.0)

## 2018-06-13 LAB — LACTIC ACID, PLASMA: Lactic Acid, Venous: 2 mmol/L (ref 0.5–1.9)

## 2018-06-13 LAB — LIPASE, BLOOD: Lipase: 28 U/L (ref 11–51)

## 2018-06-13 LAB — TROPONIN I: Troponin I: <0.03 ng/mL (ref <0.03)

## 2018-06-13 LAB — FIBRIN DERIVATIVES D-DIMER (ARMC ONLY): Fibrin derivatives D-dimer (ARMC): 461.39 ng/mL (FEU) (ref 0.00–499.00)

## 2018-06-13 MED ORDER — ONDANSETRON HCL 4 MG/2ML IJ SOLN
4.0000 mg | INTRAMUSCULAR | Status: AC
  Administered 2018-06-13: 4 mg via INTRAVENOUS
  Filled 2018-06-13: qty 2

## 2018-06-13 MED ORDER — ONDANSETRON HCL 4 MG/2ML IJ SOLN
4.0000 mg | Freq: Once | INTRAMUSCULAR | Status: AC
  Administered 2018-06-13: 4 mg via INTRAVENOUS
  Filled 2018-06-13: qty 2

## 2018-06-13 MED ORDER — DEXAMETHASONE SODIUM PHOSPHATE 10 MG/ML IJ SOLN
10.0000 mg | Freq: Once | INTRAMUSCULAR | Status: AC
  Administered 2018-06-13: 10 mg via INTRAVENOUS
  Filled 2018-06-13: qty 1

## 2018-06-13 MED ORDER — IOPAMIDOL (ISOVUE-370) INJECTION 76%
100.0000 mL | Freq: Once | INTRAVENOUS | Status: AC | PRN
  Administered 2018-06-13: 100 mL via INTRAVENOUS

## 2018-06-13 MED ORDER — EPINEPHRINE 0.3 MG/0.3ML IJ SOAJ: 0.3000 mg | Freq: Once | INTRAMUSCULAR | 0 refills | Status: AC

## 2018-06-13 MED ORDER — SODIUM CHLORIDE 0.9 % IV BOLUS
1000.0000 mL | Freq: Once | INTRAVENOUS | Status: AC
  Administered 2018-06-13: 1000 mL via INTRAVENOUS

## 2018-06-13 NOTE — ED Notes (Signed)
Pt up to toilet 

## 2018-06-13 NOTE — ED Notes (Signed)
MD at bedside. 

## 2018-06-13 NOTE — ED Provider Notes (Signed)
[facility] Emergency Department Provider Note  ____________________________________________   First MD Initiated Contact with Patient 06/13/18 1528     (approximate)  I have reviewed the triage vital signs and the nursing notes.   HISTORY  Chief Complaint Chest Pain    HPI Shannon Patterson is a 52 y.o. female with medical history as listed below who presents by EMS for evaluation of acute onset of multiple symptoms including chest pain and back pain.  In short, she says that she was in her usual state of health except for some right-sided neck pain that she believes was musculoskeletal.  She was sitting outside on the porch smoking a cigarette when she was bitten on the top of her back upper thigh at the crease with her buttocks by some sort of and her other insect.  It was acute, sharp, and stabbing pain, and immediately all the rest of her symptoms occurred: Chest pain, upper back pain, profuse sweating, anxiety and nervousness, generalized mild abdominal pain, and nausea.  The symptoms are feeling better now and have resolved except for the chest pain and back pain which she says are severe.  She received Benadryl 25 mg intramuscular by EMS but no other treatment.  She has no known allergies to insects or medications and has never had an anaphylactic reaction.  She denies recent illnesses and states she was in her normal state of health until she was bitten by the insect other than the right-sided neck pain.  She denies fever/chills, shortness of breath, wheezing, difficulty swallowing, vomiting, and dysuria.  She currently says that her symptoms are severe and she has never felt anything like it in the past and she appears fairly anxious.  She denies any history of heart problems except for paroxysmal SVT which is generally well controlled on metoprolol.  Past Medical History:  Diagnosis Date  . Anxiety   . History of Papanicolaou smear of cervix 08/01/2012   -/-   . Hyperlipidemia   . Hyperthyroidism   . SVT (supraventricular tachycardia) ([place])   . VBAC (vaginal birth after Cesarean) 1996    Patient Active Problem List   Diagnosis Date Noted  . Internal hemorrhoid, bleeding 02/21/2018  . Anxiety 03/21/2017  . Current smoker 03/21/2017  . Elevated LDL cholesterol level 03/21/2017  . Generalized abdominal pain 03/21/2017  . Hypothyroidism, postop 03/21/2017  . Pain in joint, multiple sites 03/21/2017  . Atypical chest pain 09/28/2015  . SOB (shortness of breath) 09/28/2015  . SVT (supraventricular tachycardia) ([place]) 09/28/2015  . Peroneal tendinitis of left lower extremity 03/30/2015  . Stress fracture of metatarsal bone of left foot 02/09/2015    Past Surgical History:  Procedure Laterality Date  . ABDOMINAL HYSTERECTOMY  ~2000   HAS OVARIES/CX; PJR  . CESAREAN SECTION  1991   FTP; PJR  . COLONOSCOPY WITH PROPOFOL N/A 01/30/2018   Procedure: COLONOSCOPY WITH PROPOFOL;  Surgeon: Jonathon Bellows, MD;  Location: [facility] ENDOSCOPY;  Service: Gastroenterology;  Laterality: N/A;  . 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 ESOPHAGOGASTRODUODENOSCOPY (EGD) WITH PROPOFOL N/A 01/30/2018   Procedure: ESOPHAGOGASTRODUODENOSCOPY (EGD) WITH PROPOFOL;  Surgeon: Jonathon Bellows, MD;  Location: [facility] ENDOSCOPY;  Service: Gastroenterology;  Laterality: N/A;  . HYSTEROSALPINGOGRAM  02/04/2009  . THYROIDECTOMY     PT WAS IN HER 20s    Prior to Admission medications   Medication Sig Start Date End Date Taking? Authorizing Provider  EPINEPHrine (EPIPEN 2-PAK) 0.3 mg/0.3 mL IJ  SOAJ injection Inject 0.3 mLs (0.3 mg total) into the muscle once for 1 dose. Take for severe allergic reaction, then come immediately to the Emergency Department or call 911. 06/13/18 06/13/18  Hinda Kehr, MD  estradiol (MINIVELLE) 0.0375 MG/24HR Place 1 patch onto the skin 2 (two) times a week. 07/05/77   Copland, Deirdre Evener, PA-C  ferrous sulfate 325 (65 FE) MG EC  tablet Take 1 tablet (325 mg total) by mouth 2 (two) times daily. 01/30/18 01/30/19  Jonathon Bellows, MD  levothyroxine (SYNTHROID) 100 MCG tablet Take 100 mcg by mouth daily. [place] 09/09/15   [provider]  LORazepam (ATIVAN) 0.5 MG tablet Take 0.5 mg by mouth every 8 (eight) hours. [place]    [provider]  metoprolol succinate (TOPROL-XL) 50 MG 24 hr tablet Take 50 mg by mouth daily. Take with or immediately following a meal.    [provider]  polyethylene glycol (MIRALAX) packet Take 17 g by mouth daily. 01/30/18   Jonathon Bellows, MD  rosuvastatin (CRESTOR) 10 MG tablet Take 10 mg by mouth daily. 09/27/15   [provider]  flecainide (TAMBOCOR) 50 MG tablet Take 50 mg by mouth 2 (two) times daily. 09/28/15 01/24/18  [provider]    Allergies Lidocaine; Prednisone; and Procaine  Family History  Problem Relation Age of Onset  . Breast cancer Mother 36  . Cancer Mother   . Hypertension Mother   . Colon polyps Father   . Breast cancer Maternal Grandmother 80  . Cancer Maternal Grandmother     Social History Social History   Tobacco Use  . Smoking status: Current Every Day Smoker    Packs/day: 1.00  . Smokeless tobacco: Never Used  Substance Use Topics  . Alcohol use: Yes    Alcohol/week: 5.0 standard drinks    Types: 5 Standard drinks or equivalent per week  . Drug use: No    Review of Systems Constitutional: No fever/chills Eyes: No visual changes. ENT: No sore throat. Cardiovascular: chest pain as described above Respiratory: Denies shortness of breath. Gastrointestinal: Epigastric abdominal pain.  Nausea, no vomiting.  No diarrhea.  No constipation. Genitourinary: Negative for dysuria. Musculoskeletal: Negative for neck pain.  Back pain as described above Integumentary: Negative for rash.  Acute onset diaphoresis Neurological: Negative for headaches, focal weakness or  numbness.   ____________________________________________   PHYSICAL EXAM:  VITAL SIGNS: ED Triage Vitals  Enc Vitals Group     BP 06/13/18 1530 (!) 118/95     Pulse Rate 06/13/18 1530 94     Resp 06/13/18 1532 16     Temp 06/13/18 1532 (!) 97.4 F (36.3 C)     Temp Source 06/13/18 1532 Oral     SpO2 06/13/18 1530 97 %     Weight 06/13/18 1536 66.7 kg (147 lb)     Height 06/13/18 1536 1.626 m (5\' 4" )     Head Circumference --      Peak Flow --      Pain Score 06/13/18 1534 3     Pain Loc --      Pain Edu? --      Excl. in [place]? --     Constitutional: Alert and oriented.  Anxious and appears uncomfortable.  No difficulty breathing at this time. Eyes: Conjunctivae are normal.  Head: Atraumatic. Nose: No congestion/rhinnorhea. Mouth/Throat: Mucous membranes are moist. Neck: No stridor.  No meningeal signs.   Cardiovascular: Normal rate, regular rhythm. Good peripheral circulation. Grossly normal heart sounds.  Respiratory: Normal respiratory effort.  No retractions. Lungs CTAB. Gastrointestinal: Soft and nondistended with mild diffuse tenderness to palpation of the abdomen but no rebound or guarding. Musculoskeletal: No lower extremity tenderness nor edema. No gross deformities of extremities.  Patient is reporting pain between her shoulder blades in the middle of her thoracic spine but has no tenderness to palpation. Neurologic:  Normal speech and language. No gross focal neurologic deficits are appreciated.  No urticaria or erythematous rash. Skin:  Skin is warm, dry and intact. No rash noted. Psychiatric: Mood and affect are anxious but generally appropriate under the circumstances.  ____________________________________________   LABS (all labs ordered are listed, but only abnormal results are displayed)  Labs Reviewed  COMPREHENSIVE METABOLIC PANEL - Abnormal; Notable for the following components:      Result Value   Potassium 3.3 (*)    Glucose, Bld 134 (*)    All  other components within normal limits  LACTIC ACID, PLASMA - Abnormal; Notable for the following components:   Lactic Acid, Venous 2.0 (*)    All other components within normal limits  CBC WITH DIFFERENTIAL/PLATELET - Abnormal; Notable for the following components:   RDW 27.7 (*)    Lymphs Abs 4.2 (*)    All other components within normal limits  LIPASE, BLOOD  TROPONIN I  FIBRIN DERIVATIVES D-DIMER ([facility] ONLY)  LACTIC ACID, PLASMA  URINALYSIS, ROUTINE W REFLEX MICROSCOPIC   ____________________________________________  EKG  ED ECG REPORT I, Hinda Kehr, the attending physician, personally viewed and interpreted this ECG.  Date: 06/13/2018 EKG Time: 15:42 Rate: 86 Rhythm: normal sinus rhythm QRS Axis: normal Intervals: normal ST/T Wave abnormalities: normal Narrative Interpretation: no evidence of acute ischemia  ____________________________________________  RADIOLOGY I, Hinda Kehr, personally viewed and evaluated these images (plain radiographs) as part of my medical decision making, as well as reviewing the written report by the radiologist.  ED MD interpretation:  No acute abnormalities on CXR and CTA  Official radiology report(s): Dg Chest Port 1 View  Result Date: 06/13/2018 CLINICAL DATA:  Patient with sharp chest pain.  Dizziness. EXAM: PORTABLE CHEST 1 VIEW COMPARISON:  Chest CT 07/06/2011 FINDINGS: Monitoring leads overlie the patient. Stable cardiac and mediastinal contours. No consolidative pulmonary opacities. No pleural effusion or pneumothorax. IMPRESSION: No acute cardiopulmonary process. Electronically Signed   By: Lovey Newcomer M.D.   On: 06/13/2018 16:14   Ct Angio Chest/abd/pel For Dissection W And/or W/wo  Result Date: 06/13/2018 CLINICAL DATA:  52 y/o  F; sharp chest pain, dizziness, nausea. EXAM: CT ANGIOGRAPHY CHEST, ABDOMEN AND PELVIS TECHNIQUE: Multidetector CT imaging through the chest, abdomen and pelvis was performed using the standard protocol  during bolus administration of intravenous contrast. Multiplanar reconstructed images and MIPs were obtained and reviewed to evaluate the vascular anatomy. CONTRAST:  17mL ISOVUE-370 IOPAMIDOL (ISOVUE-370) INJECTION 76% COMPARISON:  None. FINDINGS: CTA CHEST FINDINGS Cardiovascular: Preferential opacification of the thoracic aorta. No evidence of thoracic aortic aneurysm or dissection. Normal heart size. No pericardial effusion. Mediastinum/Nodes: No enlarged mediastinal, hilar, or axillary lymph nodes. Thyroid gland, trachea, and esophagus demonstrate no significant findings. Lungs/Pleura: Lungs are clear. No pleural effusion or pneumothorax. Musculoskeletal: No chest wall abnormality. No acute or significant osseous findings. Review of the MIP images confirms the above findings. CTA ABDOMEN AND PELVIS FINDINGS VASCULAR Aorta: Normal caliber aorta without aneurysm, dissection, vasculitis or significant stenosis. Celiac: Patent without evidence of aneurysm, dissection, vasculitis or significant stenosis. SMA: Patent without evidence of aneurysm, dissection, vasculitis or significant stenosis.  Renals: Both renal arteries are patent without evidence of aneurysm, dissection, vasculitis, fibromuscular dysplasia or significant stenosis. IMA: Patent without evidence of aneurysm, dissection, vasculitis or significant stenosis. Inflow: Patent without evidence of aneurysm, dissection, vasculitis or significant stenosis. Veins: No obvious venous abnormality within the limitations of this arterial phase study. Review of the MIP images confirms the above findings. NON-VASCULAR Hepatobiliary: No focal liver abnormality is seen. No gallstones, gallbladder wall thickening, or biliary dilatation. Pancreas: Unremarkable. No pancreatic ductal dilatation or surrounding inflammatory changes. Spleen: Normal in size without focal abnormality. Adrenals/Urinary Tract: Adrenal glands are unremarkable. No focal kidney lesion. Punctate  nonobstructing stones in the left upper lower poles of the kidney. No hydronephrosis. Normal bladder. Stomach/Bowel: Stomach is within normal limits. Appendix appears normal. No evidence of bowel wall thickening, distention, or inflammatory changes. Lymphatic: Aortic atherosclerosis. No enlarged abdominal or pelvic lymph nodes. Reproductive: Status post hysterectomy. No adnexal masses. Other: No abdominal wall hernia or abnormality. Small volume of pelvic fluid, likely physiologic. Musculoskeletal: No fracture is seen. T11-12 degenerative anterior endplate sclerotic changes. Review of the MIP images confirms the above findings. IMPRESSION: 1. No acute vascular abnormality.  No aortic dissection or aneurysm. 2. No acute process of chest, abdomen, or pelvis identified. 3. Mild calcific atherosclerosis of the abdominal aorta. 4. Left kidney punctate nonobstructing stones. Electronically Signed   By: Kristine Garbe M.D.   On: 06/13/2018 18:48    ____________________________________________   PROCEDURES  Critical Care performed: No   Procedure(s) performed:   Procedures   ____________________________________________   INITIAL IMPRESSION / ASSESSMENT AND PLAN / ED COURSE  As part of my medical decision making, I reviewed the following data within the [place] History obtained from family, Nursing notes reviewed and incorporated, Labs reviewed , EKG interpreted , Old chart reviewed, Radiograph reviewed  and Notes from prior ED visits    Differential diagnosis includes, but is not limited to, anaphylaxis from insect bite, aortic dissection, pulmonary embolism, ACS, pneumonia, musculoskeletal pain/strain.  The history is unusual and the thing that makes the most sense based on the acute onset of all of the symptoms as anaphylaxis.  However the patient is already feeling better after only Benadryl 25 mg intramuscular, has no rash or itching, has not actually had any  vomiting, and is not hypotensive nor tachycardic.  Rather than to confuse the situation by doing too many treatments at once, I am going to give her some Zofran for some mild persistent nausea but hold off on any additional treatment unless clinically she requires it.  In the meantime I have ordered a broad spectrum of lab work including a d-dimer given the possibility that the acute onset of chest pain and back pain could be related to either pulmonary embolism or aortic dissection, both of which should theoretically result in an elevated d-dimer.  Chest x-ray is pending, EKG is unremarkable with no evidence of ischemia.  I will monitor carefully and change my work-up based on any clinical changes, including the possible need for epinephrine intramuscular injection, but at this point she has no clinical evidence of anaphylaxis and she has a history of SVT and I am concerned that I may create some new issues by administering epinephrine when it is not indicated.  Clinical Course as of Jun 13 1949  Fri Jun 13, 2018  1612 Normal CBC, no leukocytosis nor anemia  CBC with Differential(!) [CF]  1723 The patient was feeling better but then she got up to go  have her second bowel movement and became quite lightheaded.  She is in bed now and her blood pressure is about 83/55 but she is otherwise asymptomatic, just lightheaded and feeling "blah".  Still some very mild abdominal discomfort but no acute pain anymore and she does not appear to be in distress.I am providing 1 L normal saline IV bolus.  The only lab work that was significantly abnormal was a lactic acid of 2.  This may be due to her decreased oral intake or her respiratory status; thus far I have not identified a source of infection and she does not meet sepsis criteria.  We will recheck the lactic acid after rehydration likely with his much as 2 L of fluids and she has no history of CHF.  She is once again nauseated I am providing another Zofran 4 mg IV.    [CF]  1757 Patient is again stating that she feels worse with borderline hypotension after liter of fluids.  She continues to report chest and back pain as well as abdomen.  I am going to go ahead and scan her CTA chest/abdomen/pelvis given that she has no other signs of anaphylaxis but is continue to report symptoms that could be the result of dissection.   [CF]  1943 No acute abnormalities on CTA chest, abdomen, and pelvis.  CT Angio Chest/Abd/Pel for Dissection W and/or W/WO [CF]  1943 I checked on the patient and she is smiling and happy, states that she feels great, no residual symptoms at all.  She has been observed for 4 hours and 20 minutes in the emergency department and now feels very well, hungry, no pain anywhere.  We discussed getting a repeat troponin but agreed that she is at low risk for ACS and there is very little benefit to getting a second troponin at this point.  Most likely she had an allergic reaction to the insect bite and anxiety likely contributed to the situation.   [CF]  1944 She is very comfortable with the plan to go home.  She has had problems with prednisone in the past but the Decadron should last for couple of days.  I we will also order EpiPen's for her and counseled her about the use and the necessity to come to the emergency department.  I gave strict return precautions and she and her husband understand and agree with the plan.   [CF]    Clinical Course User Index [CF] Hinda Kehr, MD    ____________________________________________  FINAL CLINICAL IMPRESSION(S) / ED DIAGNOSES  Final diagnoses:  Allergic reaction, initial encounter  Atypical chest pain  Generalized abdominal pain     MEDICATIONS GIVEN DURING THIS VISIT:  Medications  ondansetron (ZOFRAN) injection 4 mg (4 mg Intravenous Given 06/13/18 1551)  sodium chloride 0.9 % bolus 1,000 mL (0 mLs Intravenous Stopped 06/13/18 1935)  ondansetron (ZOFRAN) injection 4 mg (4 mg Intravenous Given  06/13/18 1747)  dexamethasone (DECADRON) injection 10 mg (10 mg Intravenous Given 06/13/18 1747)  iopamidol (ISOVUE-370) 76 % injection 100 mL (100 mLs Intravenous Contrast Given 06/13/18 1826)     ED Discharge Orders         Ordered    EPINEPHrine (EPIPEN 2-PAK) 0.3 mg/0.3 mL IJ SOAJ injection   Once     06/13/18 1946           Note:  This document was prepared using Dragon voice recognition software and may include unintentional dictation errors.    Hinda Kehr, MD 06/13/18 1950

## 2018-06-13 NOTE — ED Notes (Signed)
Pt advised of needing urine sample. Pt also stating dizziness and sweating when she tried to get up to use the bathroom. MD informed

## 2018-06-13 NOTE — ED Triage Notes (Signed)
Pt to ED via [facility], Pt from home where she got bit by an ant then started having sharp chest pain dizzy and nausea.NKA to ant bites. Denies SOB. EMS states NSR on monitor, BP 148/88, pules 90. EMS gave 25 mg IM of Benadryl. A&O x4.

## 2018-06-13 NOTE — ED Notes (Signed)
Date and time results received: 06/13/18 1720 (use smartphrase ".now" to insert current time)  Test: lactic Critical Value: 2.0  Name of Provider Notified: Karma Greaser  Orders Received? Or Actions Taken?: Orders Received - See Orders for details

## 2018-06-13 NOTE — Discharge Instructions (Signed)
As we discussed, your work-up was relatively reassuring.  To the best of my ability based on your symptoms and evaluation, I think that you had a mild anaphylactic reaction to the insect bite.  You were given a dose of steroids here that should work for a couple of days.  I encourage you to continue taking Benadryl as written on the over-the-counter label instructions.  I also prescribed for you some epinephrine injectors should you have worsening symptoms at home, or if you are bitten by another insect and have similar symptoms.  Remember that you must come to the emergency department immediately after using the EpiPen if you have to do so because you can have a rebound reaction.  Follow-up with your regular doctor.  Return to the emergency department if you develop new or worsening symptoms that concern you.

## 2018-06-13 NOTE — ED Notes (Signed)
Pt has small redness and raised area noted under left upper thigh.

## 2018-06-20 ENCOUNTER — Ambulatory Visit: Payer: BLUE CROSS/BLUE SHIELD | Admitting: Gastroenterology

## 2018-06-20 ENCOUNTER — Encounter: Payer: Self-pay | Admitting: Gastroenterology

## 2018-06-20 ENCOUNTER — Other Ambulatory Visit: Payer: Self-pay

## 2018-06-20 VITALS — BP 162/107 | HR 60 | Resp 16 | Ht 64.0 in | Wt 148.8 lb

## 2018-06-20 DIAGNOSIS — K642 Third degree hemorrhoids: Secondary | ICD-10-CM | POA: Diagnosis not present

## 2018-06-20 NOTE — Progress Notes (Signed)

## 2018-06-20 NOTE — Progress Notes (Signed)
Cephas Darby, MD [address]  [place]  [place], [place] [address]  Main: [phone]  Fax: [phone]    Gastroenterology Consultation  Referring Provider:     Juline Patch, MD Primary Care Physician:  Juline Patch, MD Primary Gastroenterologist:  Dr. Jonathon Bellows Reason for Consultation:  Grade 3 hemorrhoids        HPI:   Shannon Patterson is a 52 y.o. female referred by Dr. Juline Patch, MD  for consultation & management of rectal bleeding. Patient has several years history of intermittent rectal bleeding, rectal pain and pressure, burning, perianal itching, prolapse. She reports that ver since her child was born who is now 89 years old, she has been spending about 20-30 minutes on toilet every day although she denies constipation or diarrhea. She does not strain or push. She has a constant feeling of incomplete emptying which forces her to spend several minutes on toilet. She feels that her anal sphincter remains wide open after the bowel movement and has to take extra effort to voluntarily squeeze her perianal area in order to close the sphincter.   She has history of iron deficiency anemia, underwent EGD and colonoscopy last month, was negative for evidence of celiac disease, she had 2 polyps that were removed and large internal hemorrhoids. Therefore, she is referred here for hemorrhoid ligation  Currently, she is taking oral iron along with MiraLAX  Follow up visit 06/20/2018 Patient underwent hemorrhoid ligation of the right posterior and left lateral hemorrhoids in May 2019. Rectal bleeding has resolved. She continues to have prolapse of the hemorrhoids associated with discomfort after longer hours of standing or after a bowel movement. This is her only concern at this time. She is taking MiraLAX which helps him fasted emptying of her bowel.  NSAIDs: none  Antiplts/Anticoagulants/Anti thrombotics: none  GI Procedures:  EGD and colonoscopy on  01/30/2018 EGD - Normal stomach. - Normal esophagus. - Normal examined duodenum. Biopsied. Colonoscopy - Two 2 to 3 mm polyps in the transverse colon and in the cecum, removed with a cold biopsy forceps. Resected and retrieved. - One 12 mm polyp at the hepatic flexure, removed with mucosal resection. Resected and retrieved. Clip was placed. - Non-bleeding internal hemorrhoids. - The examination was otherwise normal. DIAGNOSIS:  A. DUODENUM, RANDOM; COLD BIOPSY:  - UNREMARKABLE SMALL INTESTINAL MUCOSA.  - NEGATIVE FOR FEATURES OF CELIAC, DYSPLASIA, AND MALIGNANCY.   B. COLON POLYP, CECUM; COLD BIOPSY:  - TUBULAR ADENOMA.  - NEGATIVE FOR HIGH-GRADE DYSPLASIA AND MALIGNANCY.   C. COLON POLYP, HEPATIC FLEXURE; HOT SNARE:  - SESSILE SERRATED ADENOMA.  - NEGATIVE FOR HIGH-GRADE DYSPLASIA AND MALIGNANCY.   D. COLON POLYP, TRANSVERSE; COLD BIOPSY:  - SESSILE SERRATED ADENOMA.  - NEGATIVE FOR HIGH-GRADE DYSPLASIA AND MALIGNANCY.   Past Medical History:  Diagnosis Date  . Anxiety   . History of Papanicolaou smear of cervix 08/01/2012   -/-  . Hyperlipidemia   . Hyperthyroidism   . SVT (supraventricular tachycardia) ([place])   . VBAC (vaginal birth after Cesarean) 1996    Past Surgical History:  Procedure Laterality Date  . ABDOMINAL HYSTERECTOMY  ~2000   HAS OVARIES/CX; PJR  . CESAREAN SECTION  1991   FTP; PJR  . COLONOSCOPY WITH PROPOFOL N/A 01/30/2018   Procedure: COLONOSCOPY WITH PROPOFOL;  Surgeon: Jonathon Bellows, MD;  Location: [facility] ENDOSCOPY;  Service: Gastroenterology;  Laterality: N/A;  . 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  ESOPHAGOGASTRODUODENOSCOPY (EGD) WITH PROPOFOL N/A 01/30/2018   Procedure: ESOPHAGOGASTRODUODENOSCOPY (EGD) WITH PROPOFOL;  Surgeon: Jonathon Bellows, MD;  Location: [facility] ENDOSCOPY;  Service: Gastroenterology;  Laterality: N/A;  . HYSTEROSALPINGOGRAM  02/04/2009  . THYROIDECTOMY     PT WAS IN HER 20s    Current  Outpatient Medications:  .  EPINEPHrine 0.3 mg/0.3 mL IJ SOAJ injection, INJECT 0.3 MLS INTO MUSCLE ONCE. TAKE FOR SEVERE ALLERGIC REACTION, THEN COME TO ER OR CALL 911, Disp: , Rfl: 0 .  estradiol (MINIVELLE) 0.0375 MG/24HR, Place 1 patch onto the skin 2 (two) times a week., Disp: 8 patch, Rfl: 11 .  ferrous sulfate 325 (65 FE) MG EC tablet, Take 1 tablet (325 mg total) by mouth 2 (two) times daily., Disp: 60 tablet, Rfl: 3 .  levothyroxine (SYNTHROID) 100 MCG tablet, Take 100 mcg by mouth daily. Morayarti, Disp: , Rfl:  .  LORazepam (ATIVAN) 0.5 MG tablet, Take 0.5 mg by mouth every 8 (eight) hours. Morayarti, Disp: , Rfl:  .  metoprolol succinate (TOPROL-XL) 50 MG 24 hr tablet, Take 50 mg by mouth daily. Take with or immediately following a meal., Disp: , Rfl:  .  polyethylene glycol (MIRALAX) packet, Take 17 g by mouth daily., Disp: 14 each, Rfl: 0 .  rosuvastatin (CRESTOR) 10 MG tablet, Take 10 mg by mouth daily., Disp: , Rfl:  .  levothyroxine (SYNTHROID) 88 MCG tablet, , Disp: , Rfl:     Family History  Problem Relation Age of Onset  . Breast cancer Mother 22  . Cancer Mother   . Hypertension Mother   . Colon polyps Father   . Breast cancer Maternal Grandmother 80  . Cancer Maternal Grandmother      Social History   Tobacco Use  . Smoking status: Current Every Day Smoker    Packs/day: 1.00  . Smokeless tobacco: Never Used  Substance Use Topics  . Alcohol use: Yes    Alcohol/week: 5.0 standard drinks    Types: 5 Standard drinks or equivalent per week  . Drug use: No    Allergies as of 06/20/2018 - Review Complete 06/20/2018  Allergen Reaction Noted  . Lidocaine Other (See Comments) 12/07/2015  . Prednisone  08/24/2014  . Procaine  01/24/2015    Review of Systems:    All systems reviewed and negative except where noted in HPI.   Physical Exam:  BP (!) 162/107 (BP Location: Left Arm, Patient Position: Sitting, Cuff Size: Normal)   Pulse 60   Resp 16   Ht 5\' 4"   (1.626 m)   Wt 148 lb 12.8 oz (67.5 kg)   LMP  (LMP Unknown)   BMI 25.54 kg/m  No LMP recorded (lmp unknown). Patient has had a hysterectomy.  General:   Alert,  Well-developed, well-nourished, pleasant and cooperative in NAD Head:  Normocephalic and atraumatic. Eyes:  Sclera clear, no icterus.   Conjunctiva pink. Ears:  Normal auditory acuity. Nose:  No deformity, discharge, or lesions. Mouth:  No deformity or lesions,oropharynx pink & moist. Neck:  Supple; no masses or thyromegaly. Lungs:  Respirations even and unlabored.  Clear throughout to auscultation.   No wheezes, crackles, or rhonchi. No acute distress. Heart:  Regular rate and rhythm; no murmurs, clicks, rubs, or gallops. Abdomen:  Normal bowel sounds. Soft, non-tender and non-distended without masses, hepatosplenomegaly or hernias noted.  No guarding or rebound tenderness.   Rectal: mild perianal erythema. digital rectal exam revealed large right posterior hemorrhoid, nontender Msk:  Symmetrical without gross deformities. Good,  equal movement & strength bilaterally. Pulses:  Normal pulses noted. Extremities:  No clubbing or edema.  No cyanosis. Neurologic:  Alert and oriented x3;  grossly normal neurologically. Skin:  Intact without significant lesions or rashes. No jaundice. Psych:  Alert and cooperative. Normal mood and affect.  Imaging Studies: No abdominal imaging  Assessment and Plan:   Shannon Patterson is a 52 y.o. Caucasian female with iron deficiency anemia from bleeding internal hemorrhoids. Rectal bleeding resolved after hemorrhoid ligation. However, patient continues to have prolapse of the hemorrhoids that needed to be manually reduced. She had hemorrhoid ligation of the right posterior and left lateral hemorrhoids. Given the improvement she has, I recommend further hemorrhoid ligation and she is agreeable for the procedure  - hemorrhoid ligation today - continue MiraLAX - Kegel exercises, handout  given    Follow up in 2 weeks for hemorrhoidal ligation   Cephas Darby, MD

## 2018-07-08 ENCOUNTER — Ambulatory Visit: Payer: BLUE CROSS/BLUE SHIELD | Admitting: Gastroenterology

## 2018-07-18 ENCOUNTER — Other Ambulatory Visit: Payer: Self-pay | Admitting: Gastroenterology

## 2018-07-18 NOTE — Telephone Encounter (Signed)
Pt is calling she needs rx for iron pre authorized

## 2018-07-22 ENCOUNTER — Ambulatory Visit: Payer: BLUE CROSS/BLUE SHIELD | Admitting: Gastroenterology

## 2018-07-25 ENCOUNTER — Other Ambulatory Visit: Payer: Self-pay

## 2018-07-25 MED ORDER — FERROUS SULFATE 325 (65 FE) MG PO TBEC: 325.0000 mg | DELAYED_RELEASE_TABLET | Freq: Two times a day (BID) | ORAL | 3 refills | Status: DC

## 2018-07-25 NOTE — Telephone Encounter (Signed)
Rx for iron has been sent to [facility], [place].

## 2018-07-28 ENCOUNTER — Ambulatory Visit: Payer: BLUE CROSS/BLUE SHIELD | Admitting: Gastroenterology

## 2018-07-31 LAB — CBC WITH DIFFERENTIAL/PLATELET
BASOS ABS: 0.1 10*3/uL (ref 0.0–0.2)
Basos: 1 %
EOS (ABSOLUTE): 0.2 10*3/uL (ref 0.0–0.4)
Eos: 2 %
HEMOGLOBIN: 14.9 g/dL (ref 11.1–15.9)
Hematocrit: 44 % (ref 34.0–46.6)
Immature Grans (Abs): 0 10*3/uL (ref 0.0–0.1)
Immature Granulocytes: 0 %
LYMPHS ABS: 3.2 10*3/uL — AB (ref 0.7–3.1)
Lymphs: 33 %
MCH: 31.5 pg (ref 26.6–33.0)
MCHC: 33.9 g/dL (ref 31.5–35.7)
MCV: 93 fL (ref 79–97)
Monocytes Absolute: 0.6 10*3/uL (ref 0.1–0.9)
Monocytes: 6 %
NEUTROS ABS: 5.7 10*3/uL (ref 1.4–7.0)
Neutrophils: 58 %
Platelets: 314 10*3/uL (ref 150–450)
RBC: 4.73 x10E6/uL (ref 3.77–5.28)
RDW: 15.1 % (ref 12.3–15.4)
WBC: 9.9 10*3/uL (ref 3.4–10.8)

## 2018-07-31 LAB — IRON,TIBC AND FERRITIN PANEL
FERRITIN: 56 ng/mL (ref 15–150)
Iron Saturation: 37 % (ref 15–55)
Iron: 118 ug/dL (ref 27–159)
TIBC: 320 ug/dL (ref 250–450)
UIBC: 202 ug/dL (ref 131–425)

## 2018-08-04 ENCOUNTER — Ambulatory Visit: Payer: BLUE CROSS/BLUE SHIELD | Admitting: Gastroenterology

## 2018-08-04 NOTE — Progress Notes (Deleted)
Summary of history : She is here today to follow-up for iron deficiency anemia She was initially seen in 01/2018 for rectal bleeding and abdominal pain. She had a normocytic anemia at that time. Iron % sat was low with a low ferritin of 17 , b12 normal. No blood in urine.   01/2018: EGD: normal duodenal bx, tubular adenoma of colon resected x 3 . Large internal hemorrhoids were seen in addition to polyps and referred to Dr Marius Ditch for treatment and underwent banding with resolution of rectal bleeding.     Interval history  06/05/2018  to 08/04/2018   Labs 07/30/2018: Normal iron studies, hemoglobin 14.9.      Shannon Patterson is a 52 y.o. y/o female h ere to follow up for iron deficiency anemia, rectal bleeding felt secondary to hemorrhoids which have been banded. Here today for follow up . Likely iron and B12 deficienct due to a combination of blood loss from hemorroids and poor oral absorption.   Plan  1.  Currently hemoglobin and iron studies stable.  Can stop iron oral and recheck CBC in 8 to 12 weeks.  If there is a further drop in hemoglobin then would suggest capsule study of the small bowel to evaluate for ongoing blood losses.  So far is presumed to be due to hemorrhoidal banding continue B12 supplementation.

## 2018-08-15 ENCOUNTER — Ambulatory Visit: Payer: BLUE CROSS/BLUE SHIELD | Admitting: Gastroenterology

## 2018-08-15 ENCOUNTER — Encounter: Payer: Self-pay | Admitting: Gastroenterology

## 2018-08-25 ENCOUNTER — Ambulatory Visit: Payer: BLUE CROSS/BLUE SHIELD | Admitting: Gastroenterology

## 2018-08-25 ENCOUNTER — Encounter: Payer: Self-pay | Admitting: Gastroenterology

## 2018-08-25 VITALS — BP 148/96 | HR 76 | Ht 64.0 in | Wt 150.8 lb

## 2018-08-25 DIAGNOSIS — K625 Hemorrhage of anus and rectum: Secondary | ICD-10-CM

## 2018-08-25 DIAGNOSIS — D500 Iron deficiency anemia secondary to blood loss (chronic): Secondary | ICD-10-CM

## 2018-08-25 DIAGNOSIS — R140 Abdominal distension (gaseous): Secondary | ICD-10-CM | POA: Diagnosis not present

## 2018-08-25 NOTE — Progress Notes (Signed)
Jonathon Bellows MD, MRCP(U.K) [address]  [place]  [place], [place] [address]  Main: [phone]  Fax: [phone]   Primary Care Physician: Juline Patch, MD  Primary Gastroenterologist:  Dr. Jonathon Bellows   No chief complaint on file.   HPI: Shannon Patterson is a 52 y.o. female   Summary of history :  She was initially seen in 01/2018 for rectal bleeding and abdominal pain. She had a normocytic anemia with iron deficiency.  b12 normal. No blood in urine.   01/2018: EGD: normal duodenal bx, tubular adenoma of colon resected x 3 . Large internal hemorrhoids were seen in addition to polyps and referred to Dr Marius Ditch for treatment and underwent banding with resolution of rectal bleeding, last round of treatment on 06/20/18 .   Interval history 06/05/2018 -08/25/18   Repeat iron studies 07/30/18-normal, Hb 14.9   Feels she would like another round of banding with Dr Marius Ditch , overall much improved. On miralax. Doing well.   Says she has had issues with abdominal pain for many years. Lots of bloating, no gas. No artificial sugars in her diet.    Current Outpatient Medications  Medication Sig Dispense Refill  . EPINEPHrine 0.3 mg/0.3 mL IJ SOAJ injection INJECT 0.3 MLS INTO MUSCLE ONCE. TAKE FOR SEVERE ALLERGIC REACTION, THEN COME TO ER OR CALL 911  0  . estradiol (MINIVELLE) 0.0375 MG/24HR Place 1 patch onto the skin 2 (two) times a week. 8 patch 11  . ferrous sulfate 325 (65 FE) MG EC tablet Take 1 tablet (325 mg total) by mouth 2 (two) times daily. 60 tablet 3  . levothyroxine (SYNTHROID) 100 MCG tablet Take 100 mcg by mouth daily. Morayarti    . levothyroxine (SYNTHROID) 88 MCG tablet     . LORazepam (ATIVAN) 0.5 MG tablet Take 0.5 mg by mouth every 8 (eight) hours. Morayarti    . metoprolol succinate (TOPROL-XL) 50 MG 24 hr tablet Take 50 mg by mouth daily. Take with or immediately following a meal.    . polyethylene glycol (MIRALAX) packet Take 17 g by mouth daily. 14 each  0  . rosuvastatin (CRESTOR) 10 MG tablet Take 10 mg by mouth daily.     No current facility-administered medications for this visit.     Allergies as of 08/25/2018 - Review Complete 06/20/2018  Allergen Reaction Noted  . Lidocaine Other (See Comments) 12/07/2015  . Prednisone  08/24/2014  . Procaine  01/24/2015    ROS:  General: Negative for anorexia, weight loss, fever, chills, fatigue, weakness. ENT: Negative for hoarseness, difficulty swallowing , nasal congestion. CV: Negative for chest pain, angina, palpitations, dyspnea on exertion, peripheral edema.  Respiratory: Negative for dyspnea at rest, dyspnea on exertion, cough, sputum, wheezing.  GI: See history of present illness. GU:  Negative for dysuria, hematuria, urinary incontinence, urinary frequency, nocturnal urination.  Endo: Negative for unusual weight change.    Physical Examination:   LMP  (LMP Unknown)   General: Well-nourished, well-developed in no acute distress.  Eyes: No icterus. Conjunctivae pink. Mouth: Oropharyngeal mucosa moist and pink , no lesions erythema or exudate. Lungs: Clear to auscultation bilaterally. Non-labored. Heart: Regular rate and rhythm, no murmurs rubs or gallops.  Abdomen: Bowel sounds are normal, nontender, nondistended, no hepatosplenomegaly or masses, no abdominal bruits or hernia , no rebound or guarding.   Extremities: No lower extremity edema. No clubbing or deformities. Neuro: Alert and oriented x 3.  Grossly intact. Skin: Warm and dry, no jaundice.  Psych: Alert and cooperative, normal mood and affect.   Imaging Studies: No results found.  Assessment and Plan:   Shannon Patterson is a 52 y.o. y/o female  here to follow up for iron deficiency anemia, rectal bleeding felt secondary to hemorrhoids which have been banded. Here today for follow up . Likely prior anemia due to bleeding hemorrhoids.   Plan  1. Schedule appointment with Dr Marius Ditch for another round of hemorrhoidal  banding  2. Stop oral iron today . Trial of IB guard and low fodmap diet to try for bloating - if does not work will try xifaxan at next visit.  .   Dr Jonathon Bellows  MD,MRCP [facility]) Follow up in 4 months.

## 2018-10-10 ENCOUNTER — Ambulatory Visit: Payer: BLUE CROSS/BLUE SHIELD | Admitting: Gastroenterology

## 2018-10-10 ENCOUNTER — Encounter

## 2018-11-17 ENCOUNTER — Other Ambulatory Visit: Payer: Self-pay

## 2018-11-17 ENCOUNTER — Encounter: Payer: Self-pay | Admitting: Gastroenterology

## 2018-11-17 ENCOUNTER — Ambulatory Visit: Payer: BLUE CROSS/BLUE SHIELD | Admitting: Gastroenterology

## 2018-11-17 VITALS — BP 152/103 | HR 82 | Ht 64.0 in | Wt 150.4 lb

## 2018-11-17 DIAGNOSIS — R198 Other specified symptoms and signs involving the digestive system and abdomen: Secondary | ICD-10-CM

## 2018-11-17 DIAGNOSIS — K5902 Outlet dysfunction constipation: Secondary | ICD-10-CM

## 2018-11-17 DIAGNOSIS — K642 Third degree hemorrhoids: Secondary | ICD-10-CM | POA: Diagnosis not present

## 2018-11-17 DIAGNOSIS — D500 Iron deficiency anemia secondary to blood loss (chronic): Secondary | ICD-10-CM

## 2018-11-17 NOTE — Progress Notes (Signed)
Cephas Darby, MD [address]  [place]  [place], [place] [address]  Main: [phone]  Fax: [phone]    Gastroenterology Consultation  Referring Provider:     Juline Patch, MD Primary Care Physician:  Juline Patch, MD Primary Gastroenterologist:  Dr. Jonathon Bellows Reason for Consultation:  Grade 3 hemorrhoids        HPI:   MARYNELL BIES is a 53 y.o. female referred by Dr. Juline Patch, MD  for consultation & management of rectal bleeding. Patient has several years history of intermittent rectal bleeding, rectal pain and pressure, burning, perianal itching, prolapse. She reports that ver since her child was born who is now 60 years old, she has been spending about 20-30 minutes on toilet every day although she denies constipation or diarrhea. She does not strain or push. She has a constant feeling of incomplete emptying which forces her to spend several minutes on toilet. She feels that her anal sphincter remains wide open after the bowel movement and has to take extra effort to voluntarily squeeze her perianal area in order to close the sphincter.   She has history of iron deficiency anemia, underwent EGD and colonoscopy last month, was negative for evidence of celiac disease, she had 2 polyps that were removed and large internal hemorrhoids. Therefore, she is referred here for hemorrhoid ligation   Follow up visit 06/20/2018 Patient underwent hemorrhoid ligation of the right posterior and left lateral hemorrhoids in May 2019. Rectal bleeding has resolved. She continues to have prolapse of the hemorrhoids associated with discomfort after longer hours of standing or after a bowel movement. This is her only concern at this time. She is taking MiraLAX which helps with faster emptying of her bowel  Follow-up visit 11/17/2018 Patient underwent hemorrhoid ligation of the right posterior hemorrhoid x2, left lateral hemorrhoid.  She has ongoing prolapse of the hemorrhoid  which is uncomfortable.  She is no longer having rectal bleeding.  Overall, she notices significant improvement in the severity of prolapsed hemorrhoid tissue.  She is here to discuss for repeat ligation.  She is taking MiraLAX to keep her bowels soft, in fact her stools are pudding like consistency.  She continues to spend 10 to 15 minutes on toilet with each bowel movement.   NSAIDs: none  Antiplts/Anticoagulants/Anti thrombotics: none  GI Procedures:  EGD and colonoscopy on 01/30/2018 EGD - Normal stomach. - Normal esophagus. - Normal examined duodenum. Biopsied. Colonoscopy - Two 2 to 3 mm polyps in the transverse colon and in the cecum, removed with a cold biopsy forceps. Resected and retrieved. - One 12 mm polyp at the hepatic flexure, removed with mucosal resection. Resected and retrieved. Clip was placed. - Non-bleeding internal hemorrhoids. - The examination was otherwise normal. DIAGNOSIS:  A. DUODENUM, RANDOM; COLD BIOPSY:  - UNREMARKABLE SMALL INTESTINAL MUCOSA.  - NEGATIVE FOR FEATURES OF CELIAC, DYSPLASIA, AND MALIGNANCY.   B. COLON POLYP, CECUM; COLD BIOPSY:  - TUBULAR ADENOMA.  - NEGATIVE FOR HIGH-GRADE DYSPLASIA AND MALIGNANCY.   C. COLON POLYP, HEPATIC FLEXURE; HOT SNARE:  - SESSILE SERRATED ADENOMA.  - NEGATIVE FOR HIGH-GRADE DYSPLASIA AND MALIGNANCY.   D. COLON POLYP, TRANSVERSE; COLD BIOPSY:  - SESSILE SERRATED ADENOMA.  - NEGATIVE FOR HIGH-GRADE DYSPLASIA AND MALIGNANCY.   Past Medical History:  Diagnosis Date  . Anxiety   . History of Papanicolaou smear of cervix 08/01/2012   -/-  . Hyperlipidemia   . Hyperthyroidism   . SVT (supraventricular tachycardia) (  [place])   . VBAC (vaginal birth after Cesarean) 1996    Past Surgical History:  Procedure Laterality Date  . ABDOMINAL HYSTERECTOMY  ~2000   HAS OVARIES/CX; PJR  . CESAREAN SECTION  1991   FTP; PJR  . COLONOSCOPY WITH PROPOFOL N/A 01/30/2018   Procedure: COLONOSCOPY WITH PROPOFOL;   Surgeon: Jonathon Bellows, MD;  Location: [facility] ENDOSCOPY;  Service: Gastroenterology;  Laterality: N/A;  . 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 ESOPHAGOGASTRODUODENOSCOPY (EGD) WITH PROPOFOL N/A 01/30/2018   Procedure: ESOPHAGOGASTRODUODENOSCOPY (EGD) WITH PROPOFOL;  Surgeon: Jonathon Bellows, MD;  Location: [facility] ENDOSCOPY;  Service: Gastroenterology;  Laterality: N/A;  . HYSTEROSALPINGOGRAM  02/04/2009  . THYROIDECTOMY     PT WAS IN HER 20s    Current Outpatient Medications:  .  EPINEPHrine 0.3 mg/0.3 mL IJ SOAJ injection, INJECT 0.3 MLS INTO MUSCLE ONCE. TAKE FOR SEVERE ALLERGIC REACTION, THEN COME TO ER OR CALL 911, Disp: , Rfl: 0 .  levothyroxine (SYNTHROID) 100 MCG tablet, Take 100 mcg by mouth daily. Morayarti, Disp: , Rfl:  .  LORazepam (ATIVAN) 0.5 MG tablet, Take 0.5 mg by mouth every 8 (eight) hours. Morayarti, Disp: , Rfl:  .  metoprolol succinate (TOPROL-XL) 50 MG 24 hr tablet, Take 50 mg by mouth daily. Take with or immediately following a meal., Disp: , Rfl:  .  polyethylene glycol (MIRALAX) packet, Take 17 g by mouth daily., Disp: 14 each, Rfl: 0 .  rosuvastatin (CRESTOR) 10 MG tablet, Take 10 mg by mouth daily., Disp: , Rfl:  .  estradiol (MINIVELLE) 0.0375 MG/24HR, Place 1 patch onto the skin 2 (two) times a week. (Patient not taking: Reported on 08/25/2018), Disp: 8 patch, Rfl: 11 .  ferrous sulfate 325 (65 FE) MG EC tablet, Take 1 tablet (325 mg total) by mouth 2 (two) times daily. (Patient not taking: Reported on 11/17/2018), Disp: 60 tablet, Rfl: 3    Family History  Problem Relation Age of Onset  . Breast cancer Mother 64  . Cancer Mother   . Hypertension Mother   . Colon polyps Father   . Breast cancer Maternal Grandmother 80  . Cancer Maternal Grandmother      Social History   Tobacco Use  . Smoking status: Current Every Day Smoker    Packs/day: 1.00  . Smokeless tobacco: Never Used  Substance Use Topics  . Alcohol use: Yes     Alcohol/week: 5.0 standard drinks    Types: 5 Standard drinks or equivalent per week  . Drug use: No    Allergies as of 11/17/2018 - Review Complete 11/17/2018  Allergen Reaction Noted  . Lidocaine Other (See Comments) 12/07/2015  . Prednisone  08/24/2014  . Procaine  01/24/2015    Review of Systems:    All systems reviewed and negative except where noted in HPI.   Physical Exam:  BP (!) 152/103   Pulse 82   Ht 5\' 4"  (1.626 m)   Wt 150 lb 6.4 oz (68.2 kg)   LMP  (LMP Unknown)   BMI 25.82 kg/m  No LMP recorded (lmp unknown). Patient has had a hysterectomy.  General:   Alert,  Well-developed, well-nourished, pleasant and cooperative in NAD Head:  Normocephalic and atraumatic. Eyes:  Sclera clear, no icterus.   Conjunctiva pink. Ears:  Normal auditory acuity. Nose:  No deformity, discharge, or lesions. Mouth:  No deformity or lesions,oropharynx pink & moist. Neck:  Supple; no masses or thyromegaly. Lungs:  Respirations even and unlabored.  Clear throughout to auscultation.   No wheezes, crackles, or rhonchi. No acute distress. Heart:  Regular rate and rhythm; no murmurs, clicks, rubs, or gallops. Abdomen:  Normal bowel sounds. Soft, non-tender and non-distended without masses, hepatosplenomegaly or hernias noted.  No guarding or rebound tenderness.   Rectal: mild perianal erythema. digital rectal exam revealed large right anterior hemorrhoid, nontender Msk:  Symmetrical without gross deformities. Good, equal movement & strength bilaterally. Pulses:  Normal pulses noted. Extremities:  No clubbing or edema.  No cyanosis. Neurologic:  Alert and oriented x3;  grossly normal neurologically. Skin:  Intact without significant lesions or rashes. No jaundice. Psych:  Alert and cooperative. Normal mood and affect.  Imaging Studies: No abdominal imaging  Assessment and Plan:   ADALYNA GODBEE is a 53 y.o. Caucasian female with iron deficiency anemia from bleeding internal  hemorrhoids. Rectal bleeding resolved after hemorrhoid ligation. However, patient continues to have prolapse of the hemorrhoids that need to be manually reduced. She had hemorrhoid ligation of the right posteriorx2 and left lateral hemorrhoids. Given the improvement she has, I recommend further hemorrhoid ligation and she is agreeable for the procedure  - hemorrhoid ligation today -Since her stools are very soft, I asked her to cut down on MiraLAX every other day and incorporate more fiber to her diet -Continue Kegel exercises -Advised her not to spend more than 5 minutes on toilet for each bowel movement -Will refer her to pelvic floor biofeedback therapist due to decreased tone of anal sphincter    Follow up in 2 weeks for hemorrhoidal ligation   Cephas Darby, MD

## 2018-11-17 NOTE — Progress Notes (Signed)
PROCEDURE NOTE: The patient presents with symptomatic grade 3 hemorrhoids, unresponsive to maximal medical therapy, requesting rubber band ligation of his/her hemorrhoidal disease.  All risks, benefits and alternative forms of therapy were described and informed consent was obtained.  The decision was made to band the RA internal hemorrhoid, and the [facility] System was used to perform band ligation without complication.  Digital anorectal examination was then performed to assure proper positioning of the band, and to adjust the banded tissue as required.  The patient was discharged home without pain or other issues.  Dietary and behavioral recommendations were given and (if necessary - prescriptions were given), along with follow-up instructions.  The patient will return 2 weeks for follow-up and possible additional banding as required.  No complications were encountered and the patient tolerated the procedure well.    

## 2018-11-18 LAB — B12 AND FOLATE PANEL
FOLATE: 12.1 ng/mL (ref >3.0)
Vitamin B-12: 1193 pg/mL (ref 232–1245)

## 2018-11-18 LAB — IRON,TIBC AND FERRITIN PANEL
Ferritin: 59 ng/mL (ref 15–150)
IRON: 112 ug/dL (ref 27–159)
Iron Saturation: 34 % (ref 15–55)
Total Iron Binding Capacity: 325 ug/dL (ref 250–450)
UIBC: 213 ug/dL (ref 131–425)

## 2018-11-18 LAB — CBC WITH DIFFERENTIAL/PLATELET
Basophils Absolute: 0.1 10*3/uL (ref 0.0–0.2)
Basos: 1 %
EOS (ABSOLUTE): 0.2 10*3/uL (ref 0.0–0.4)
EOS: 3 %
HEMATOCRIT: 43.7 % (ref 34.0–46.6)
Hemoglobin: 14.9 g/dL (ref 11.1–15.9)
IMMATURE GRANULOCYTES: 0 %
Immature Grans (Abs): 0 10*3/uL (ref 0.0–0.1)
Lymphocytes Absolute: 3.1 10*3/uL (ref 0.7–3.1)
Lymphs: 37 %
MCH: 33.5 pg — ABNORMAL HIGH (ref 26.6–33.0)
MCHC: 34.1 g/dL (ref 31.5–35.7)
MCV: 98 fL — ABNORMAL HIGH (ref 79–97)
MONOS ABS: 0.6 10*3/uL (ref 0.1–0.9)
Monocytes: 7 %
NEUTROS PCT: 52 %
Neutrophils Absolute: 4.5 10*3/uL (ref 1.4–7.0)
PLATELETS: 277 10*3/uL (ref 150–450)
RBC: 4.45 x10E6/uL (ref 3.77–5.28)
RDW: 12.7 % (ref 11.7–15.4)
WBC: 8.5 10*3/uL (ref 3.4–10.8)

## 2018-11-18 LAB — CELIAC AB TTG DGP TIGA
Antigliadin Abs, IgA: 3 units (ref 0–19)
GLIADIN IGG: 2 U (ref 0–19)
IgA/Immunoglobulin A, Serum: 289 mg/dL (ref 87–352)
Tissue Transglut Ab: <2 U/mL (ref 0–5)

## 2018-11-21 ENCOUNTER — Encounter: Payer: Self-pay | Admitting: Gastroenterology

## 2018-11-25 ENCOUNTER — Encounter: Payer: Self-pay | Admitting: Gastroenterology

## 2018-11-25 ENCOUNTER — Ambulatory Visit: Payer: BLUE CROSS/BLUE SHIELD | Admitting: Gastroenterology

## 2018-11-25 VITALS — BP 138/90 | HR 83 | Ht 64.0 in | Wt 151.6 lb

## 2018-11-25 DIAGNOSIS — K648 Other hemorrhoids: Secondary | ICD-10-CM

## 2018-11-25 DIAGNOSIS — D500 Iron deficiency anemia secondary to blood loss (chronic): Secondary | ICD-10-CM | POA: Diagnosis not present

## 2018-11-25 NOTE — Progress Notes (Signed)
Jonathon Bellows MD, MRCP(U.K) [address]  [place]  [place], [place] [address]  Main: [phone]  Fax: [phone]   Primary Care Physician: Juline Patch, MD  Primary Gastroenterologist:  Dr. Jonathon Bellows   Chief Complaint  Patient presents with  . Follow-up    Iron deficiency anemia    HPI: Shannon Patterson is a 53 y.o. female    Summary of history :  She was initially seen in 01/2018 for rectal bleeding and abdominal pain. She had a normocytic anemia with iron deficiency.  b12 normal. No blood in urine.   01/2018: EGD: normal duodenal bx, tubular adenoma of colon resected x 3 . Large internal hemorrhoids were seen in addition to polyps and referred to Dr Marius Ditch for treatment and underwent banding with resolution of rectal bleeding Repeat iron studies 07/30/18-normal, Hb 14.9   Interval history11/4/19 -11/25/2018  11/17/2018 had further banding of hemorrhoids with Dr Marius Ditch  11/17/2018 : Iron studies , CBC, normal off iron   Feels she would like another round of banding with Dr Marius Ditch , overall much improved. On miralax. Doing well.   Stable issues with gas and bloating.     Current Outpatient Medications  Medication Sig Dispense Refill  . EPINEPHrine 0.3 mg/0.3 mL IJ SOAJ injection INJECT 0.3 MLS INTO MUSCLE ONCE. TAKE FOR SEVERE ALLERGIC REACTION, THEN COME TO ER OR CALL 911  0  . levothyroxine (SYNTHROID) 100 MCG tablet Take 100 mcg by mouth daily. Morayarti    . LORazepam (ATIVAN) 0.5 MG tablet Take 0.5 mg by mouth every 8 (eight) hours. Morayarti    . metoprolol succinate (TOPROL-XL) 50 MG 24 hr tablet Take 50 mg by mouth daily. Take with or immediately following a meal.    . polyethylene glycol (MIRALAX) packet Take 17 g by mouth daily. 14 each 0  . rosuvastatin (CRESTOR) 10 MG tablet Take 10 mg by mouth daily.    Marland Kitchen estradiol (MINIVELLE) 0.0375 MG/24HR Place 1 patch onto the skin 2 (two) times a week. (Patient not taking: Reported on 08/25/2018) 8 patch 11   . ferrous sulfate 325 (65 FE) MG EC tablet Take 1 tablet (325 mg total) by mouth 2 (two) times daily. (Patient not taking: Reported on 11/17/2018) 60 tablet 3   No current facility-administered medications for this visit.     Allergies as of 11/25/2018 - Review Complete 11/25/2018  Allergen Reaction Noted  . Lidocaine Other (See Comments) 12/07/2015  . Prednisone  08/24/2014  . Procaine  01/24/2015    ROS:  General: Negative for anorexia, weight loss, fever, chills, fatigue, weakness. ENT: Negative for hoarseness, difficulty swallowing , nasal congestion. CV: Negative for chest pain, angina, palpitations, dyspnea on exertion, peripheral edema.  Respiratory: Negative for dyspnea at rest, dyspnea on exertion, cough, sputum, wheezing.  GI: See history of present illness. GU:  Negative for dysuria, hematuria, urinary incontinence, urinary frequency, nocturnal urination.  Endo: Negative for unusual weight change.    Physical Examination:   BP 138/90   Pulse 83   Ht 5\' 4"  (1.626 m)   Wt 151 lb 9.6 oz (68.8 kg)   LMP  (LMP Unknown)   BMI 26.02 kg/m   General: Well-nourished, well-developed in no acute distress.  Eyes: No icterus. Conjunctivae pink. Mouth: Oropharyngeal mucosa moist and pink , no lesions erythema or exudate. Lungs: Clear to auscultation bilaterally. Non-labored. Heart: Regular rate and rhythm, no murmurs rubs or gallops.  Abdomen: Bowel sounds are normal, nontender, nondistended, no hepatosplenomegaly  or masses, no abdominal bruits or hernia , no rebound or guarding.   Extremities: No lower extremity edema. No clubbing or deformities. Neuro: Alert and oriented x 3.  Grossly intact. Skin: Warm and dry, no jaundice.   Psych: Alert and cooperative, normal mood and affect.   Imaging Studies: No results found.  Assessment and Plan:   Shannon Patterson is a 53 y.o. y/o femalehere to follow up for iron deficiency anemia, rectal bleeding felt secondary to hemorrhoids  which have been banded. Here today for follow up . Likely prior anemia due to bleeding hemorrhoids. CBC,iron studies stable off Iron. Can staff off iron and follow up with PCP .  Marland Kitchen   Dr Jonathon Bellows  MD,MRCP [facility]) Follow up PRN

## 2018-12-10 ENCOUNTER — Encounter: Payer: Self-pay | Admitting: Obstetrics and Gynecology

## 2018-12-10 ENCOUNTER — Ambulatory Visit (INDEPENDENT_AMBULATORY_CARE_PROVIDER_SITE_OTHER): Payer: BLUE CROSS/BLUE SHIELD | Admitting: Obstetrics and Gynecology

## 2018-12-10 VITALS — BP 158/100 | HR 78 | Ht 64.0 in | Wt 150.0 lb

## 2018-12-10 DIAGNOSIS — Z01419 Encounter for gynecological examination (general) (routine) without abnormal findings: Secondary | ICD-10-CM | POA: Diagnosis not present

## 2018-12-10 DIAGNOSIS — Z1239 Encounter for other screening for malignant neoplasm of breast: Secondary | ICD-10-CM

## 2018-12-10 NOTE — Progress Notes (Signed)
Gynecology Annual Exam  PCP: Juline Patch, MD  Chief Complaint:  Chief Complaint  Patient presents with  . Gynecologic Exam    History of Present Illness:Patient is a 53 y.o. [id number] presents for annual exam. The patient has no complaints today.   LMP: No LMP recorded (lmp unknown). Patient has had a hysterectomy.  The patient is not sexually active. She denies dyspareunia.  There is no notable family history of breast or ovarian cancer in her family.  The patient wears seatbelts: yes.   The patient has regular exercise: yes.    The patient denies current symptoms of depression.     Review of Systems: Review of Systems  Constitutional: Negative for chills and fever.  HENT: Negative for congestion.   Respiratory: Negative for cough and shortness of breath.   Cardiovascular: Negative for chest pain and palpitations.  Gastrointestinal: Negative for abdominal pain, constipation, diarrhea, heartburn, nausea and vomiting.  Genitourinary: Negative for dysuria, frequency and urgency.  Skin: Negative for itching and rash.  Neurological: Negative for dizziness and headaches.  Endo/Heme/Allergies: Negative for polydipsia.  Psychiatric/Behavioral: Negative for depression.    Past Medical History:  Past Medical History:  Diagnosis Date  . Anxiety   . History of Papanicolaou smear of cervix 08/01/2012   -/-  . Hyperlipidemia   . Hyperthyroidism   . Internal hemorrhoid, bleeding 02/21/2018  . SOB (shortness of breath) 09/28/2015  . SVT (supraventricular tachycardia) ([facility])   . VBAC (vaginal birth after Cesarean) 1996    Past Surgical History:  Past Surgical History:  Procedure Laterality Date  . ABDOMINAL HYSTERECTOMY  ~2000   HAS OVARIES/CX; PJR  . CESAREAN SECTION  1991   FTP; PJR  . COLONOSCOPY WITH PROPOFOL N/A 01/30/2018   Procedure: COLONOSCOPY WITH PROPOFOL;  Surgeon: Jonathon Bellows, MD;  Location[facility] ENDOSCOPY;  Service: Gastroenterology;  Laterality: N/A;  .  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 ESOPHAGOGASTRODUODENOSCOPY (EGD) WITH PROPOFOL N/A 01/30/2018   Procedure: ESOPHAGOGASTRODUODENOSCOPY (EGD) WITH PROPOFOL;  Surgeon: Jonathon Bellows, MD;  Location: [facility] ENDOSCOPY;  Service: Gastroenterology;  Laterality: N/A;  . HYSTEROSALPINGOGRAM  02/04/2009  . THYROIDECTOMY     PT WAS IN HER 20s    Gynecologic History:  No LMP recorded (lmp unknown). Patient has had a hysterectomy. Last Pap: Results were: 01/04/2017 NIL and HR HPV negative  Last mammogram: 05/12/2018 Results were: BI-RAD I  Obstetric History: [id number]  Family History:  Family History  Problem Relation Age of Onset  . Breast cancer Mother 15  . Cancer Mother   . Hypertension Mother   . Colon polyps Father   . Breast cancer Maternal Grandmother 80  . Cancer Maternal Grandmother     Social History:  Social History   Socioeconomic History  . Marital status: Married    Spouse name: Not on file  . Number of children: Not on file  . Years of education: Not on file  . Highest education level: Not on file  Occupational History  . Not on file  Social Needs  . Financial resource strain: Not on file  . Food insecurity:    Worry: Not on file    Inability: Not on file  . Transportation needs:    Medical: Not on file    Non-medical: Not on file  Tobacco Use  . Smoking status: Current Every Day Smoker    Packs/day: 1.00  . Smokeless tobacco: Never Used  Substance and Sexual Activity  .  Alcohol use: Yes    Alcohol/week: 5.0 standard drinks    Types: 5 Standard drinks or equivalent per week  . Drug use: No  . Sexual activity: Yes    Birth control/protection: Surgical  Lifestyle  . Physical activity:    Days per week: Not on file    Minutes per session: Not on file  . Stress: Not on file  Relationships  . Social connections:    Talks on phone: Not on file    Gets together: Not on file    Attends religious service: Not on file    Active  member of club or organization: Not on file    Attends meetings of clubs or organizations: Not on file    Relationship status: Not on file  . Intimate partner violence:    Fear of current or ex partner: Not on file    Emotionally abused: Not on file    Physically abused: Not on file    Forced sexual activity: Not on file  Other Topics Concern  . Not on file  Social History Narrative  . Not on file    Allergies:  Allergies  Allergen Reactions  . Lidocaine Other (See Comments)    tachycardia  . Prednisone     Other reaction(s): Other (See Comments) Very emotional -   . Procaine     Medications: Prior to Admission medications   Medication Sig Start Date End Date Taking? Authorizing Provider  EPINEPHrine 0.3 mg/0.3 mL IJ SOAJ injection INJECT 0.3 MLS INTO MUSCLE ONCE. TAKE FOR SEVERE ALLERGIC REACTION, THEN COME TO ER OR CALL 911 06/14/18  Yes [provider]  estradiol (MINIVELLE) 0.0375 MG/24HR Place 1 patch onto the skin 2 (two) times a week. 7/90/24  Yes Copland, Elmo Putt B, PA-C  ferrous sulfate 325 (65 FE) MG EC tablet Take 1 tablet (325 mg total) by mouth 2 (two) times daily. 07/25/18 07/25/19 Yes Jonathon Bellows, MD  levothyroxine (SYNTHROID) 100 MCG tablet Take 100 mcg by mouth daily. Morayarti 09/09/15  Yes [provider]  LORazepam (ATIVAN) 0.5 MG tablet Take 0.5 mg by mouth every 8 (eight) hours. Morayarti   Yes [provider]  metoprolol succinate (TOPROL-XL) 50 MG 24 hr tablet Take 50 mg by mouth daily. Take with or immediately following a meal.   Yes [provider]  polyethylene glycol (MIRALAX) packet Take 17 g by mouth daily. 01/30/18  Yes Jonathon Bellows, MD  rosuvastatin (CRESTOR) 10 MG tablet Take 10 mg by mouth daily. 09/27/15  Yes [provider]  flecainide (TAMBOCOR) 50 MG tablet Take 50 mg by mouth 2 (two) times daily. 09/28/15 01/24/18  [provider]    Physical Exam Vitals: Blood pressure (!) 158/100, pulse 78,  height 5\' 4"  (1.626 m), weight 150 lb (68 kg).  General: NAD HEENT: normocephalic, anicteric Thyroid: no enlargement, no palpable nodules Pulmonary: No increased work of breathing, CTAB Cardiovascular: RRR, distal pulses 2+ Breast: Breast symmetrical, no tenderness, no palpable nodules or masses, no skin or nipple retraction present, no nipple discharge.  No axillary or supraclavicular lymphadenopathy. Abdomen: NABS, soft, non-tender, non-distended.  Umbilicus without lesions.  No hepatomegaly, splenomegaly or masses palpable. No evidence of hernia  Genitourinary:  External: Normal external female genitalia.  Normal urethral meatus, normal Bartholin's and Skene's glands.    Vagina: Normal vaginal mucosa, no evidence of prolapse.    Cervix: Grossly normal in appearance, no bleeding  Uterus: surgically absent  Adnexa: ovaries non-enlarged, no adnexal masses  Rectal:  deferred  Lymphatic: no evidence of inguinal lymphadenopathy Extremities: no edema, erythema, or tenderness Neurologic: Grossly intact Psychiatric: mood appropriate, affect full  Female chaperone present for pelvic and breast  portions of the physical exam     Assessment: 53 y.o. [id number] routine annual exam  Plan: Problem List Items Addressed This Visit    None    Visit Diagnoses    Encounter for gynecological examination without abnormal finding    -  Primary   Breast screening       Relevant Orders   MM 3D SCREEN BREAST BILATERAL      1) Mammogram - recommend yearly screening mammogram.  Mammogram Is up to date  2) STI screening  was notoffered and therefore not obtained  3) ASCCP guidelines and rational discussed.  Patient opts for every 3 years screening interval - s/p prior supracervical hysterectomy for simple hyperplasia without atypia  4) Osteoporosis  - per USPTF routine screening DEXA at age 65 - FRAX 23 year major fracture risk 3.6%,  10 year hip fracture risk 0.2%  Consider FDA-approved medical  therapies in postmenopausal women and men aged 41 years and older, based on the following: a) A hip or vertebral (clinical or morphometric) fracture b) T-score ? -2.5 at the femoral neck or spine after appropriate evaluation to exclude secondary causes C) Low bone mass (T-score between -1.0 and -2.5 at the femoral neck or spine) and a 10-year probability of a hip fracture ? 3% or a 10-year probability of a major osteoporosis-related fracture ? 20% based on the [place]-adapted WHO algorithm   5) Routine healthcare maintenance including cholesterol, diabetes screening discussed managed by PCP  6) Colonoscopy UTD 01/30/2018  7) Return in about 1 year (around 12/11/2019) for annual.    Malachy Mood, MD Mosetta Pigeon, [place] 12/10/2018, 2:10 PM

## 2018-12-12 ENCOUNTER — Ambulatory Visit: Payer: BLUE CROSS/BLUE SHIELD | Admitting: Gastroenterology

## 2019-01-30 ENCOUNTER — Encounter: Payer: Self-pay | Admitting: Gastroenterology

## 2019-02-02 NOTE — Telephone Encounter (Signed)
Jadijah schedule web visit

## 2019-02-06 ENCOUNTER — Other Ambulatory Visit: Payer: Self-pay

## 2019-02-06 ENCOUNTER — Ambulatory Visit (INDEPENDENT_AMBULATORY_CARE_PROVIDER_SITE_OTHER): Payer: BLUE CROSS/BLUE SHIELD | Admitting: Gastroenterology

## 2019-02-06 DIAGNOSIS — K642 Third degree hemorrhoids: Secondary | ICD-10-CM

## 2019-02-06 NOTE — Progress Notes (Signed)
Shannon Patterson , MD [address]  [place]  [place], [place] [address]  Main: [phone]  Fax: [phone]   Primary Care Physician: Juline Patch, MD  Virtual Visit via Video Note  I connected with patient on 02/06/19 at  8:30 AM EDT by video and verified that I am speaking with the correct person using two identifiers.   I discussed the limitations, risks, security and privacy concerns of performing an evaluation and management service by video  and the availability of in person appointments. I also discussed with the patient that there may be a patient responsible charge related to this service. The patient expressed understanding and agreed to proceed.  Location of Patient: Home Location of Provider: Home Persons involved: Patient and provider only   History of Present Illness: No chief complaint on file.   HPI: Shannon Patterson is a 53 y.o. female  Summary of history :  She was initially seen in 01/2018 for rectal bleeding and abdominal pain. She had a normocytic anemiawith iron deficiency.b12 normal. No blood in urine.   01/2018: EGD: normal duodenal bx, tubular adenoma of colon resected x 3 . Large internal hemorrhoids were seen in addition to polyps and referred to Dr Marius Ditch for treatment and underwent banding with resolution of rectal bleeding Repeat iron studies 07/30/18-normal, Hb 14.9 11/17/2018 had further banding of hemorrhoids with Dr Marius Ditch  11/17/2018 : Iron studies , CBC, normal off iron   Interval history2/01/2019-02/06/2019  No rectal bleeding. She says the "hemorroids are coming out" painful when it comes out and hurst to go back in. This is happening every day . She wakes up in the night and has to push the hemorrhoids back in .   She says this is affecting her job   Current Outpatient Medications  Medication Sig Dispense Refill  . EPINEPHrine 0.3 mg/0.3 mL IJ SOAJ injection INJECT 0.3 MLS INTO MUSCLE ONCE. TAKE FOR SEVERE ALLERGIC  REACTION, THEN COME TO ER OR CALL 911  0  . levothyroxine (SYNTHROID) 100 MCG tablet Take 100 mcg by mouth daily. Morayarti    . LORazepam (ATIVAN) 0.5 MG tablet Take 0.5 mg by mouth every 8 (eight) hours. Morayarti    . metoprolol succinate (TOPROL-XL) 50 MG 24 hr tablet Take 50 mg by mouth daily. Take with or immediately following a meal.    . polyethylene glycol (MIRALAX) packet Take 17 g by mouth daily. 14 each 0  . rosuvastatin (CRESTOR) 10 MG tablet Take 10 mg by mouth daily.    Marland Kitchen estradiol (MINIVELLE) 0.0375 MG/24HR Place 1 patch onto the skin 2 (two) times a week. (Patient not taking: Reported on 02/06/2019) 8 patch 11  . ferrous sulfate 325 (65 FE) MG EC tablet Take 1 tablet (325 mg total) by mouth 2 (two) times daily. (Patient not taking: Reported on 02/06/2019) 60 tablet 3   No current facility-administered medications for this visit.     Allergies as of 02/06/2019 - Review Complete 02/06/2019  Allergen Reaction Noted  . Lidocaine Other (See Comments) 12/07/2015  . Prednisone  08/24/2014  . Procaine  01/24/2015    Review of Systems:    All systems reviewed and negative except where noted in HPI.  General Appearance:    Alert, cooperative, no distress, appears stated age  Head:    Normocephalic, without obvious abnormality, atraumatic  Eyes:    PERRL, conjunctiva/corneas clear,  Ears:    Grossly normal hearing    Neurologic:  Grossly normal  Observations/Objective:  Labs: CMP     Component Value Date/Time   NA 141 06/13/2018 1547   K 3.3 (L) 06/13/2018 1547   CL 105 06/13/2018 1547   CO2 25 06/13/2018 1547   GLUCOSE 134 (H) 06/13/2018 1547   BUN 13 06/13/2018 1547   CREATININE 0.80 06/13/2018 1547   CALCIUM 9.4 06/13/2018 1547   PROT 7.3 06/13/2018 1547   ALBUMIN 4.2 06/13/2018 1547   AST 29 06/13/2018 1547   ALT 19 06/13/2018 1547   ALKPHOS 67 06/13/2018 1547   BILITOT 0.9 06/13/2018 1547   GFRNONAA >60 06/13/2018 1547   GFRAA >60 06/13/2018 1547   Lab  Results  Component Value Date   WBC 8.5 11/17/2018   HGB 14.9 11/17/2018   HCT 43.7 11/17/2018   MCV 98 (H) 11/17/2018   PLT 277 11/17/2018    Imaging Studies: No results found.  Assessment and Plan:   Shannon Patterson is a 53 y.o. y/o femalehere to follow up for iron deficiency anemia, rectal bleeding felt secondary to hemorrhoids which have been banded. Main issue today is not bleeding but pain prolapsing hemorrhoids by her description which I feel now has failed banding and needs surgery. She is in reasonable discomfort and hence will refer for evaluation with Dr Dahlia Byes .Marland Kitchen      I discussed the assessment and treatment plan with the patient. The patient was provided an opportunity to ask questions and all were answered. The patient agreed with the plan and demonstrated an understanding of the instructions.   The patient was advised to call back or seek an in-person evaluation if the symptoms worsen or if the condition fails to improve as anticipated.    Dr Shannon Bellows MD,MRCP [facility]) Gastroenterology/Hepatology Pager: [phone]   Speech recognition software was used to dictate this note.

## 2019-02-09 ENCOUNTER — Ambulatory Visit (INDEPENDENT_AMBULATORY_CARE_PROVIDER_SITE_OTHER): Payer: BLUE CROSS/BLUE SHIELD | Admitting: Surgery

## 2019-02-09 ENCOUNTER — Other Ambulatory Visit: Payer: Self-pay

## 2019-02-09 ENCOUNTER — Encounter: Payer: Self-pay | Admitting: Surgery

## 2019-02-09 VITALS — BP 162/90 | HR 82 | Temp 97.8°F | Ht 65.0 in | Wt 151.0 lb

## 2019-02-09 DIAGNOSIS — K642 Third degree hemorrhoids: Secondary | ICD-10-CM | POA: Diagnosis not present

## 2019-02-09 NOTE — Patient Instructions (Signed)
Patient to be scheduled for hemorrhoidectomy surgery when we can ,.

## 2019-02-10 ENCOUNTER — Encounter: Payer: Self-pay | Admitting: Surgery

## 2019-02-10 NOTE — Progress Notes (Signed)
Patient ID: Shannon Patterson, female   DOB: [birth year], 53 y.o.   MRN: [id number]  HPI Shannon Patterson is a 53 y.o. female seen in consultation at the request of Dr. Vicente Males for symptomatic hemorrhoids. She reports some mild sharp pain that worsens when she stands up, she experiences prolapse of the hemorrhoids. She has had a long hx of hemorrhoids and has tried creams, sitz bath and even banding. She did have a colonoscopy w a few non cancerous polyps as well as an EGD, I have pers. reviewed the images. She endorses some chronic abd bloating and discomfort likely IBS. No fevers, chill, no hematochezia. HPI  Past Medical History:  Diagnosis Date  . Anxiety   . History of Papanicolaou smear of cervix 08/01/2012   -/-  . Hyperlipidemia   . Hyperthyroidism   . Internal hemorrhoid, bleeding 02/21/2018  . SOB (shortness of breath) 09/28/2015  . SVT (supraventricular tachycardia) ([facility])   . VBAC (vaginal birth after Cesarean) 1996    Past Surgical History:  Procedure Laterality Date  . ABDOMINAL HYSTERECTOMY  ~2000   HAS OVARIES/CX; PJR  . CESAREAN SECTION  1991   FTP; PJR  . COLONOSCOPY WITH PROPOFOL N/A 01/30/2018   Procedure: COLONOSCOPY WITH PROPOFOL;  Surgeon: Jonathon Bellows, MD;  Location: [facility] ENDOSCOPY;  Service: Gastroenterology;  Laterality: N/A;  . 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 ESOPHAGOGASTRODUODENOSCOPY (EGD) WITH PROPOFOL N/A 01/30/2018   Procedure: ESOPHAGOGASTRODUODENOSCOPY (EGD) WITH PROPOFOL;  Surgeon: Jonathon Bellows, MD;  Location[facility] ENDOSCOPY;  Service: Gastroenterology;  Laterality: N/A;  . HYSTEROSALPINGOGRAM  02/04/2009  . THYROIDECTOMY     PT WAS IN HER 20s    Family History  Problem Relation Age of Onset  . Breast cancer Mother 52  . Cancer Mother   . Hypertension Mother   . Colon polyps Father   . Breast cancer Maternal Grandmother 80  . Cancer Maternal Grandmother     Social History Social History   Tobacco Use  .  Smoking status: Current Every Day Smoker    Packs/day: 1.00  . Smokeless tobacco: Never Used  Substance Use Topics  . Alcohol use: Yes    Alcohol/week: 5.0 standard drinks    Types: 5 Standard drinks or equivalent per week  . Drug use: No    Allergies  Allergen Reactions  . Lidocaine Other (See Comments)    tachycardia  . Prednisone     Other reaction(s): Other (See Comments) Very emotional -   . Procaine     Current Outpatient Medications  Medication Sig Dispense Refill  . EPINEPHrine 0.3 mg/0.3 mL IJ SOAJ injection INJECT 0.3 MLS INTO MUSCLE ONCE. TAKE FOR SEVERE ALLERGIC REACTION, THEN COME TO ER OR CALL 911  0  . estradiol (MINIVELLE) 0.0375 MG/24HR Place 1 patch onto the skin 2 (two) times a week. 8 patch 11  . levothyroxine (SYNTHROID) 100 MCG tablet Take 100 mcg by mouth daily. Morayarti    . LORazepam (ATIVAN) 0.5 MG tablet Take 0.5 mg by mouth every 8 (eight) hours. Morayarti    . metoprolol succinate (TOPROL-XL) 50 MG 24 hr tablet Take 50 mg by mouth daily. Take with or immediately following a meal.    . polyethylene glycol (MIRALAX) packet Take 17 g by mouth daily. 14 each 0  . rosuvastatin (CRESTOR) 10 MG tablet Take 10 mg by mouth daily.     No current facility-administered medications for this visit.      Review  of Systems Full ROS  was asked and was negative except for the information on the HPI  Physical Exam Blood pressure (!) 162/90, pulse 82, temperature 97.8 F (36.6 C), temperature source Skin, height 5\' 5"  (1.651 m), weight 151 lb (68.5 kg), SpO2 98 %. CONSTITUTIONAL: NAD EYES: Pupils are equal, round, and reactive to light, Sclera are non-icteric. EARS, NOSE, MOUTH AND THROAT: The oropharynx is clear. The oral mucosa is pink and moist. Hearing is intact to voice. LYMPH NODES:  Lymph nodes in the neck are normal. RESPIRATORY:  Lungs are clear. There is normal respiratory effort, with equal breath sounds bilaterally, and without pathologic use of  accessory muscles. CARDIOVASCULAR: Heart is regular without murmurs, gallops, or rubs. GI: The abdomen is  soft, nontender, and nondistended. There are no palpable masses. There is no hepatosplenomegaly. There are normal bowel sounds in all quadrants. Rectal:  Grade III hemorrhoids, right posterolateral right anterolateral and left posterolateral. No evidence of strangulation or perineal sepsis   MUSCULOSKELETAL: Normal muscle strength and tone. No cyanosis or edema.   SKIN: Turgor is good and there are no pathologic skin lesions or ulcers. NEUROLOGIC: Motor and sensation is grossly normal. Cranial nerves are grossly intact. PSYCH:  Oriented to person, place and time. Affect is normal.  Data Reviewed I have personally reviewed the patient's imaging, laboratory findings and medical records.    Assessment/Plan 53 yo with grade III hemorrhoids non responsive to medical rx or bandings. I do think that she will benefit from formal hemorrhoidectomy. Given the covid -19 epidemic We will have to postpone the surgery for about 6 weeks. We will keep her on our list . D/W the pt in detail about my thought process and about the proposed surgery.  Recommend fiber, sitz baths and stool softener as well as Vaseline cream. No need for emergent surgical intervention at this time  A copy of the report was sent to the referring provider    Caroleen Hamman, MD FACS General Surgeon 02/10/2019, 1:32 PM

## 2019-02-10 NOTE — H&P (View-Only) (Signed)
Patient ID: Shannon Patterson, female   DOB: [birth year], 53 y.o.   MRN: [id number]  HPI Shannon Patterson is a 53 y.o. female seen in consultation at the request of Dr. Vicente Males for symptomatic hemorrhoids. She reports some mild sharp pain that worsens when she stands up, she experiences prolapse of the hemorrhoids. She has had a long hx of hemorrhoids and has tried creams, sitz bath and even banding. She did have a colonoscopy w a few non cancerous polyps as well as an EGD, I have pers. reviewed the images. She endorses some chronic abd bloating and discomfort likely IBS. No fevers, chill, no hematochezia. HPI  Past Medical History:  Diagnosis Date  . Anxiety   . History of Papanicolaou smear of cervix 08/01/2012   -/-  . Hyperlipidemia   . Hyperthyroidism   . Internal hemorrhoid, bleeding 02/21/2018  . SOB (shortness of breath) 09/28/2015  . SVT (supraventricular tachycardia) ([place])   . VBAC (vaginal birth after Cesarean) 1996    Past Surgical History:  Procedure Laterality Date  . ABDOMINAL HYSTERECTOMY  ~2000   HAS OVARIES/CX; PJR  . CESAREAN SECTION  1991   FTP; PJR  . COLONOSCOPY WITH PROPOFOL N/A 01/30/2018   Procedure: COLONOSCOPY WITH PROPOFOL;  Surgeon: Jonathon Bellows, MD;  Location: [facility] ENDOSCOPY;  Service: Gastroenterology;  Laterality: N/A;  . 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 ESOPHAGOGASTRODUODENOSCOPY (EGD) WITH PROPOFOL N/A 01/30/2018   Procedure: ESOPHAGOGASTRODUODENOSCOPY (EGD) WITH PROPOFOL;  Surgeon: Jonathon Bellows, MD;  Location: [facility] ENDOSCOPY;  Service: Gastroenterology;  Laterality: N/A;  . HYSTEROSALPINGOGRAM  02/04/2009  . THYROIDECTOMY     PT WAS IN HER 20s    Family History  Problem Relation Age of Onset  . Breast cancer Mother 27  . Cancer Mother   . Hypertension Mother   . Colon polyps Father   . Breast cancer Maternal Grandmother 80  . Cancer Maternal Grandmother     Social History Social History   Tobacco Use  .  Smoking status: Current Every Day Smoker    Packs/day: 1.00  . Smokeless tobacco: Never Used  Substance Use Topics  . Alcohol use: Yes    Alcohol/week: 5.0 standard drinks    Types: 5 Standard drinks or equivalent per week  . Drug use: No    Allergies  Allergen Reactions  . Lidocaine Other (See Comments)    tachycardia  . Prednisone     Other reaction(s): Other (See Comments) Very emotional -   . Procaine     Current Outpatient Medications  Medication Sig Dispense Refill  . EPINEPHrine 0.3 mg/0.3 mL IJ SOAJ injection INJECT 0.3 MLS INTO MUSCLE ONCE. TAKE FOR SEVERE ALLERGIC REACTION, THEN COME TO ER OR CALL 911  0  . estradiol (MINIVELLE) 0.0375 MG/24HR Place 1 patch onto the skin 2 (two) times a week. 8 patch 11  . levothyroxine (SYNTHROID) 100 MCG tablet Take 100 mcg by mouth daily. Morayarti    . LORazepam (ATIVAN) 0.5 MG tablet Take 0.5 mg by mouth every 8 (eight) hours. Morayarti    . metoprolol succinate (TOPROL-XL) 50 MG 24 hr tablet Take 50 mg by mouth daily. Take with or immediately following a meal.    . polyethylene glycol (MIRALAX) packet Take 17 g by mouth daily. 14 each 0  . rosuvastatin (CRESTOR) 10 MG tablet Take 10 mg by mouth daily.     No current facility-administered medications for this visit.      Review  of Systems Full ROS  was asked and was negative except for the information on the HPI  Physical Exam Blood pressure (!) 162/90, pulse 82, temperature 97.8 F (36.6 C), temperature source Skin, height 5\' 5"  (1.651 m), weight 151 lb (68.5 kg), SpO2 98 %. CONSTITUTIONAL: NAD EYES: Pupils are equal, round, and reactive to light, Sclera are non-icteric. EARS, NOSE, MOUTH AND THROAT: The oropharynx is clear. The oral mucosa is pink and moist. Hearing is intact to voice. LYMPH NODES:  Lymph nodes in the neck are normal. RESPIRATORY:  Lungs are clear. There is normal respiratory effort, with equal breath sounds bilaterally, and without pathologic use of  accessory muscles. CARDIOVASCULAR: Heart is regular without murmurs, gallops, or rubs. GI: The abdomen is  soft, nontender, and nondistended. There are no palpable masses. There is no hepatosplenomegaly. There are normal bowel sounds in all quadrants. Rectal:  Grade III hemorrhoids, right posterolateral right anterolateral and left posterolateral. No evidence of strangulation or perineal sepsis   MUSCULOSKELETAL: Normal muscle strength and tone. No cyanosis or edema.   SKIN: Turgor is good and there are no pathologic skin lesions or ulcers. NEUROLOGIC: Motor and sensation is grossly normal. Cranial nerves are grossly intact. PSYCH:  Oriented to person, place and time. Affect is normal.  Data Reviewed I have personally reviewed the patient's imaging, laboratory findings and medical records.    Assessment/Plan 53 yo with grade III hemorrhoids non responsive to medical rx or bandings. I do think that she Shannon benefit from formal hemorrhoidectomy. Given the covid -19 epidemic We Shannon have to postpone the surgery for about 6 weeks. We Shannon keep her on our list . D/W the pt in detail about my thought process and about the proposed surgery.  Recommend fiber, sitz baths and stool softener as well as Vaseline cream. No need for emergent surgical intervention at this time  A copy of the report was sent to the referring provider    Caroleen Hamman, MD FACS General Surgeon 02/10/2019, 1:32 PM

## 2019-02-26 ENCOUNTER — Telehealth: Payer: Self-pay | Admitting: *Deleted

## 2019-02-26 NOTE — Telephone Encounter (Signed)
Patient contacted today to see if she is willing to go to [facility] for surgery.   The patient states she lives in [place] and that would be fine with her.   She is aware of COVID-19 testing.   I will speak with Dr. Dahlia Byes about possibly getting patient on the schedule for surgery on 03-10-19 in [place].

## 2019-02-27 NOTE — Addendum Note (Signed)
Addended by: Caroleen Hamman F on: 02/27/2019 12:30 PM   Modules accepted: Orders, SmartSet

## 2019-03-02 ENCOUNTER — Other Ambulatory Visit: Payer: Self-pay

## 2019-03-02 ENCOUNTER — Other Ambulatory Visit
Admission: RE | Admit: 2019-03-02 | Discharge: 2019-03-02 | Disposition: A | Discharge status: Home / Self Care | Payer: 59 | Source: Ambulatory Visit | Attending: Surgery | Admitting: Surgery

## 2019-03-02 DIAGNOSIS — Z1159 Encounter for screening for other viral diseases: Secondary | ICD-10-CM | POA: Diagnosis present

## 2019-03-02 NOTE — Telephone Encounter (Signed)
Shannon Patterson did speak with this patient on Friday regarding surgery at [facility] for 03-05-19 with Dr. Dahlia Byes.   Patient to have COVID-19 testing done today, 03-02-19 at [facility] drive thru testing between 10:30 and 12:30 pm.   The patient will be contacted by the staff from [facility] for further instructions about surgery.

## 2019-03-03 ENCOUNTER — Encounter: Payer: Self-pay | Admitting: *Deleted

## 2019-03-03 ENCOUNTER — Other Ambulatory Visit: Payer: Self-pay

## 2019-03-03 LAB — NOVEL CORONAVIRUS, NAA (HOSP ORDER, SEND-OUT TO REF LAB; TAT 18-24 HRS): SARS-CoV-2, NAA: NOT DETECTED

## 2019-03-05 ENCOUNTER — Encounter
Admission: RE | Disposition: A | Discharge status: Home / Self Care | Payer: Self-pay | Source: Home / Self Care | Attending: Surgery

## 2019-03-05 ENCOUNTER — Ambulatory Visit: Payer: 59 | Admitting: Anesthesiology

## 2019-03-05 ENCOUNTER — Ambulatory Visit
Admission: RE | Admit: 2019-03-05 | Discharge: 2019-03-05 | Disposition: A | Discharge status: Home / Self Care | Payer: 59 | Attending: Surgery | Admitting: Surgery

## 2019-03-05 DIAGNOSIS — E785 Hyperlipidemia, unspecified: Secondary | ICD-10-CM | POA: Diagnosis not present

## 2019-03-05 DIAGNOSIS — E0590 Thyrotoxicosis, unspecified without thyrotoxic crisis or storm: Secondary | ICD-10-CM | POA: Insufficient documentation

## 2019-03-05 DIAGNOSIS — K642 Third degree hemorrhoids: Secondary | ICD-10-CM | POA: Diagnosis not present

## 2019-03-05 DIAGNOSIS — Z79899 Other long term (current) drug therapy: Secondary | ICD-10-CM | POA: Insufficient documentation

## 2019-03-05 DIAGNOSIS — F17210 Nicotine dependence, cigarettes, uncomplicated: Secondary | ICD-10-CM | POA: Insufficient documentation

## 2019-03-05 HISTORY — PX: HEMORRHOID SURGERY: SHX153

## 2019-03-05 SURGERY — HEMORRHOIDECTOMY
Anesthesia: General

## 2019-03-05 MED ORDER — CHLORHEXIDINE GLUCONATE CLOTH 2 % EX PADS: 6.0000 | MEDICATED_PAD | Freq: Once | CUTANEOUS | Status: DC

## 2019-03-05 MED ORDER — PROPOFOL 10 MG/ML IV BOLUS
INTRAVENOUS | Status: DC | PRN
  Administered 2019-03-05: 200 mg via INTRAVENOUS

## 2019-03-05 MED ORDER — CELECOXIB 200 MG PO CAPS
200.0000 mg | ORAL_CAPSULE | ORAL | Status: AC
  Administered 2019-03-05: 08:00:00 200 mg via ORAL

## 2019-03-05 MED ORDER — CHLORHEXIDINE GLUCONATE CLOTH 2 % EX PADS
6.0000 | MEDICATED_PAD | Freq: Once | CUTANEOUS | Status: AC
  Administered 2019-03-05: 6 via TOPICAL

## 2019-03-05 MED ORDER — HYDROMORPHONE HCL 1 MG/ML IJ SOLN: 0.2500 mg | INTRAMUSCULAR | Status: DC | PRN

## 2019-03-05 MED ORDER — FENTANYL CITRATE (PF) 100 MCG/2ML IJ SOLN
INTRAMUSCULAR | Status: DC | PRN
  Administered 2019-03-05 (×2): 50 ug via INTRAVENOUS

## 2019-03-05 MED ORDER — OXYCODONE-ACETAMINOPHEN 5-325 MG PO TABS: 1.0000 | ORAL_TABLET | ORAL | 0 refills | Status: DC | PRN

## 2019-03-05 MED ORDER — LIDOCAINE HCL (CARDIAC) PF 100 MG/5ML IV SOSY
PREFILLED_SYRINGE | INTRAVENOUS | Status: DC | PRN
  Administered 2019-03-05: 30 mg via INTRAVENOUS

## 2019-03-05 MED ORDER — ONDANSETRON HCL 4 MG/2ML IJ SOLN
INTRAMUSCULAR | Status: DC | PRN
  Administered 2019-03-05: 4 mg via INTRAVENOUS

## 2019-03-05 MED ORDER — ACETAMINOPHEN 500 MG PO TABS
1000.0000 mg | ORAL_TABLET | ORAL | Status: AC
  Administered 2019-03-05: 975 mg via ORAL

## 2019-03-05 MED ORDER — BUPIVACAINE LIPOSOME 1.3 % IJ SUSP
INTRAMUSCULAR | Status: DC | PRN
  Administered 2019-03-05: 20 mL

## 2019-03-05 MED ORDER — OXYCODONE HCL 5 MG/5ML PO SOLN: 5.0000 mg | Freq: Once | ORAL | Status: DC | PRN

## 2019-03-05 MED ORDER — MIDAZOLAM HCL 2 MG/2ML IJ SOLN
INTRAMUSCULAR | Status: DC | PRN
  Administered 2019-03-05: 2 mg via INTRAVENOUS

## 2019-03-05 MED ORDER — GABAPENTIN 300 MG PO CAPS
300.0000 mg | ORAL_CAPSULE | ORAL | Status: AC
  Administered 2019-03-05: 08:00:00 300 mg via ORAL

## 2019-03-05 MED ORDER — OXYCODONE HCL 5 MG PO TABS: 5.0000 mg | ORAL_TABLET | Freq: Once | ORAL | Status: DC | PRN

## 2019-03-05 MED ORDER — LACTATED RINGERS IV SOLN
INTRAVENOUS | Status: DC
  Administered 2019-03-05: 07:00:00 via INTRAVENOUS

## 2019-03-05 MED ORDER — GLYCOPYRROLATE 0.2 MG/ML IJ SOLN
INTRAMUSCULAR | Status: DC | PRN
  Administered 2019-03-05: 0.2 mg via INTRAVENOUS

## 2019-03-05 SURGICAL SUPPLY — 24 items
BLADE CLIPPER SURG (BLADE) IMPLANT
CANISTER SUCT 1200ML W/VALVE (MISCELLANEOUS) ×3 IMPLANT
COVER LIGHT HANDLE UNIVERSAL (MISCELLANEOUS) ×6 IMPLANT
COVER MAYO STAND STRL (DRAPES) ×3 IMPLANT
DRAPE LAPAROTOMY T 102X78X121 (DRAPES) ×3 IMPLANT
DRAPE LEGGINS SURG 28X43 STRL (DRAPES) ×3 IMPLANT
ELECT REM PT RETURN 9FT ADLT (ELECTROSURGICAL) ×3
ELECTRODE REM PT RTRN 9FT ADLT (ELECTROSURGICAL) ×1 IMPLANT
GLOVE BIO SURGEON STRL SZ7 (GLOVE) ×3 IMPLANT
GOWN STRL REUS W/ TWL LRG LVL3 (GOWN DISPOSABLE) ×2 IMPLANT
GOWN STRL REUS W/TWL LRG LVL3 (GOWN DISPOSABLE) ×4
NEEDLE HYPO 25GX1X1/2 BEV (NEEDLE) ×3 IMPLANT
PACK BASIN MINOR ARMC (MISCELLANEOUS) ×3 IMPLANT
PAD ABD DERMACEA PRESS 5X9 (GAUZE/BANDAGES/DRESSINGS) ×6 IMPLANT
SHEARS HARMONIC 9CM CVD (BLADE) ×3 IMPLANT
SOL PREP PVP 2OZ (MISCELLANEOUS) ×6
SOLUTION PREP PVP 2OZ (MISCELLANEOUS) ×2 IMPLANT
SPONGE LAP 18X18 5 PK (GAUZE/BANDAGES/DRESSINGS) ×6 IMPLANT
SPONGE SURGIFOAM ABS GEL 12-7 (HEMOSTASIS) ×3 IMPLANT
STRAP BODY AND KNEE 60X3 (MISCELLANEOUS) ×3 IMPLANT
SURGILUBE 3G PEEL PACK STRL (MISCELLANEOUS) ×18 IMPLANT
SUT CHROMIC 2 0 SH (SUTURE) IMPLANT
SYR 20CC LL (SYRINGE) ×3 IMPLANT
WATER STERILE IRR 1000ML POUR (IV SOLUTION) ×3 IMPLANT

## 2019-03-05 NOTE — Anesthesia Procedure Notes (Signed)
Procedure Name: LMA Insertion Date/Time: 03/05/2019 7:52 AM Performed by: Jeannene Patella, CRNA Pre-anesthesia Checklist: Patient identified, Emergency Drugs available, Suction available, Patient being monitored and Timeout performed Patient Re-evaluated:Patient Re-evaluated prior to induction Oxygen Delivery Method: Circle system utilized Preoxygenation: Pre-oxygenation with 100% oxygen Induction Type: IV induction Ventilation: Mask ventilation without difficulty LMA: LMA inserted LMA Size: 4.0 Placement Confirmation: breath sounds checked- equal and bilateral

## 2019-03-05 NOTE — Anesthesia Preprocedure Evaluation (Signed)
Anesthesia Evaluation  Patient identified by MRN, date of birth, ID band Patient awake    Reviewed: Allergy & Precautions, H&P , NPO status , Patient's Chart, lab work & pertinent test results  Airway Mallampati: II  TM Distance: >3 FB Neck ROM: full    Dental no notable dental hx.    Pulmonary Current Smoker,    Pulmonary exam normal        Cardiovascular Normal cardiovascular examSupra Ventricular Tachycardia      Neuro/Psych    GI/Hepatic negative GI ROS, Neg liver ROS,   Endo/Other  Hypothyroidism   Renal/GU      Musculoskeletal   Abdominal   Peds  Hematology negative hematology ROS (+)   Anesthesia Other Findings   Reproductive/Obstetrics negative OB ROS                             Anesthesia Physical Anesthesia Plan  ASA: II  Anesthesia Plan: General LMA   Post-op Pain Management:    Induction:   PONV Risk Score and Plan:   Airway Management Planned:   Additional Equipment:   Intra-op Plan:   Post-operative Plan:   Informed Consent: I have reviewed the patients History and Physical, chart, labs and discussed the procedure including the risks, benefits and alternatives for the proposed anesthesia with the patient or authorized representative who has indicated his/her understanding and acceptance.       Plan Discussed with:   Anesthesia Plan Comments:         Anesthesia Quick Evaluation

## 2019-03-05 NOTE — Op Note (Signed)
  03/05/2019  8:21 AM  PATIENT:  Shannon Patterson  53 y.o. female  PRE-OPERATIVE DIAGNOSIS:  Symptomatic internal hemorrhoids  POST-OPERATIVE DIAGNOSIS:  Same  PROCEDURE:   Three column hemorrhoidectomy  (Left anterolateral , left posterolateral and Right posterolateral)  FINDINGS: Left anterolateral , left posterolateral and Right posterolateral large grade III hemorrhoidal cushions  SURGEON:  Surgeon(s) and Role:    * Tagen Brethauer, [place], MD - Primary   ANESTHESIA: General   DICTATION:  Patient was explained about the procedure in detail. Risks, benefits and possible complications and a consent was obtained. The patient taken to the operating room and placed in the Modified lithotomy position position with all pressure points padded.  Exam revealed three large hemorrhoidal columns: Left anterolateral , left posterolateral and Right posterolateral . I elevated the column with an Allis clamp and performed a hemorrhoidectomy using the harmonic scalpel. Identical procedure was performed for the remaining two columns. She did have additional hemorrhoidal cushion but given the risk of anal stenosis I choose to do three. Good hemostasis was observed. Liposomal Marcaine w/o lidocaine was injected around the wound site. Needle and laparotomy counts were correct and there were no immediate complications. Gelfoam was placed within the anal canal.  Jules Husbands, MD

## 2019-03-05 NOTE — Transfer of Care (Signed)
Immediate Anesthesia Transfer of Care Note  Patient: Shannon Patterson  Procedure(s) Performed: HEMORRHOIDECTOMY (N/A )  Patient Location: PACU  Anesthesia Type: General LMA  Level of Consciousness: awake, alert  and patient cooperative  Airway and Oxygen Therapy: Patient Spontanous Breathing and Patient connected to supplemental oxygen  Post-op Assessment: Post-op Vital signs reviewed, Patient's Cardiovascular Status Stable, Respiratory Function Stable, Patent Airway and No signs of Nausea or vomiting  Post-op Vital Signs: Reviewed and stable  Complications: No apparent anesthesia complications

## 2019-03-05 NOTE — Discharge Instructions (Addendum)
Surgical Procedures for Hemorrhoids Surgical procedures can be used to treat hemorrhoids. Hemorrhoids are swollen veins that are inside the rectum (internal hemorrhoids) or around the anus (external hemorrhoids). They are caused by increased pressure in the anal area. This pressure may result from straining to have a bowel movement (constipation), diarrhea, pregnancy, obesity, anal sex, or sitting for long periods of time. Hemorrhoids can cause symptoms such as pain and bleeding. Surgery may be needed if diet changes, lifestyle changes, and other treatments do not help your symptoms. Various surgical methods may be used. Three common methods are:  Closed hemorrhoidectomy. The hemorrhoids are surgically removed, and the surgical cuts (incisions) are closed with stitches (sutures).  Open hemorrhoidectomy. The hemorrhoids are surgically removed, but the incisions are allowed to heal without sutures.  Stapled hemorrhoidopexy. The hemorrhoids are removed using a device that takes out a ring of excess tissue. Tell a health care provider about:  Any allergies you have.  All medicines you are taking, including vitamins, herbs, eye drops, creams, and over-the-counter medicines.  Any problems you or family members have had with anesthetic medicines.  Any blood disorders you have.  Any surgeries you have had.  Any medical conditions you have.  Whether you are pregnant or may be pregnant. What are the risks? Generally, this is a safe procedure. However, problems may occur, including:  Infection.  Bleeding.  Allergic reactions to medicines.  Damage to other structures or organs.  Pain.  Constipation.  Difficulty passing urine.  Narrowing of the anal canal (stenosis).  Difficulty controlling bowel movements (incontinence). What happens before the procedure?  Ask your health care provider about: ? Changing or stopping your regular medicines. This is especially important if you are  taking diabetes medicines or blood thinners. ? Taking medicines such as aspirin and ibuprofen. These medicines can thin your blood. Do not take these medicines before your procedure if your health care provider instructs you not to.  You may need to have a procedure to examine the inside of your colon with a scope (colonoscopy). Your health care provider may do this to make sure that there are no other causes for your bleeding or pain.  Follow instructions from your health care provider about eating or drinking restrictions.  You may be instructed to take a laxative and an enema to clean out your colon before surgery (bowel prep). Carefully follow instructions from your health care provider about bowel prep.  Ask your health care provider how your surgical site will be marked or identified.  You may be given antibiotic medicine to help prevent infection.  Plan to have someone take you home after the procedure. What happens during the procedure?  To reduce your risk of infection: ? Your health care team will wash or sanitize their hands. ? Your skin will be washed with soap.  An IV tube will be inserted into one of your veins.  You will be given one or more of the following: ? A medicine to help you relax (sedative). ? A medicine to numb the area (local anesthetic). ? A medicine to make you fall asleep (general anesthetic). ? A medicine that is injected into an area of your body to numb everything below the injection site (regional anesthetic).  A lubricating jelly may be placed into your rectum.  Your surgeon will insert a short scope (anoscope) into your rectum to examine the hemorrhoids.  One of the following hemorrhoid procedures will be performed. Closed Hemorrhoidectomy  Your surgeon will use  surgical instruments to open the tissue around the hemorrhoids.  The veins that supply the hemorrhoids will be tied off with a suture.  The hemorrhoids will be removed.  The tissue  that surrounds the hemorrhoids will be closed with sutures that your body can absorb (absorbable sutures). Open Hemorrhoidectomy  The hemorrhoids will be removed with surgical instruments.  The incisions will be left open to heal without sutures. Stapled Hemorrhoidopexy  Your surgeon will use a circular stapling device to remove the hemorrhoids.  The device will be inserted into your anus. It will remove a circular ring of tissue that includes hemorrhoid tissue and some tissue above the hemorrhoids.  The staples in the device will close the edges of removed tissue. This will cut off the blood supply to the hemorrhoids and will pull any remaining hemorrhoids back into place. Each of these procedures may vary among health care providers and hospitals. What happens after the procedure?  Your blood pressure, heart rate, breathing rate, and blood oxygen level will be monitored often until the medicines you were given have worn off.  You will be given pain medicine as needed. This information is not intended to replace advice given to you by your health care provider. Make sure you discuss any questions you have with your health care provider. Document Released: 08/05/2009 Document Revised: 03/15/2016 Document Reviewed: 01/03/2015 Elsevier Interactive Patient Education  2019 [place] Anesthesia, Adult, Care After This sheet gives you information about how to care for yourself after your procedure. Your health care provider may also give you more specific instructions. If you have problems or questions, contact your health care provider. What can I expect after the procedure? After the procedure, the following side effects are common:  Pain or discomfort at the IV site.  Nausea.  Vomiting.  Sore throat.  Trouble concentrating.  Feeling cold or chills.  Weak or tired.  Sleepiness and fatigue.  Soreness and body aches. These side effects can affect parts of the body  that were not involved in surgery. Follow these instructions at home:  For at least 24 hours after the procedure:  Have a responsible adult stay with you. It is important to have someone help care for you until you are awake and alert.  Rest as needed.  Do not: ? Participate in activities in which you could fall or become injured. ? Drive. ? Use heavy machinery. ? Drink alcohol. ? Take sleeping pills or medicines that cause drowsiness. ? Make important decisions or sign legal documents. ? Take care of children on your own. Eating and drinking  Follow any instructions from your health care provider about eating or drinking restrictions.  When you feel hungry, start by eating small amounts of foods that are soft and easy to digest (bland), such as toast. Gradually return to your regular diet.  Drink enough fluid to keep your urine pale yellow.  If you vomit, rehydrate by drinking water, juice, or clear broth. General instructions  If you have sleep apnea, surgery and certain medicines can increase your risk for breathing problems. Follow instructions from your health care provider about wearing your sleep device: ? Anytime you are sleeping, including during daytime naps. ? While taking prescription pain medicines, sleeping medicines, or medicines that make you drowsy.  Return to your normal activities as told by your health care provider. Ask your health care provider what activities are safe for you.  Take over-the-counter and prescription medicines only as told by your  health care provider.  If you smoke, do not smoke without supervision.  Keep all follow-up visits as told by your health care provider. This is important. Contact a health care provider if:  You have nausea or vomiting that does not get better with medicine.  You cannot eat or drink without vomiting.  You have pain that does not get better with medicine.  You are unable to pass urine.  You develop a  skin rash.  You have a fever.  You have redness around your IV site that gets worse. Get help right away if:  You have difficulty breathing.  You have chest pain.  You have blood in your urine or stool, or you vomit blood. Summary  After the procedure, it is common to have a sore throat or nausea. It is also common to feel tired.  Have a responsible adult stay with you for the first 24 hours after general anesthesia. It is important to have someone help care for you until you are awake and alert.  When you feel hungry, start by eating small amounts of foods that are soft and easy to digest (bland), such as toast. Gradually return to your regular diet.  Drink enough fluid to keep your urine pale yellow.  Return to your normal activities as told by your health care provider. Ask your health care provider what activities are safe for you. This information is not intended to replace advice given to you by your health care provider. Make sure you discuss any questions you have with your health care provider. Document Released: 01/14/2001 Document Revised: 05/24/2017 Document Reviewed: 05/24/2017 Elsevier Interactive Patient Education  2019 [place] for Discharge Teaching: EXPAREL (bupivacaine liposome injectable suspension)   Your surgeon or anesthesiologist gave you EXPAREL(bupivacaine) to help control your pain after surgery.   EXPAREL is a local anesthetic that provides pain relief by numbing the tissue around the surgical site.  EXPAREL is designed to release pain medication over time and can control pain for up to 72 hours.  Depending on how you respond to EXPAREL, you may require less pain medication during your recovery.  Possible side effects:  Temporary loss of sensation or ability to move in the area where bupivacaine was injected.  Nausea, vomiting, constipation  Rarely, numbness and tingling in your mouth or lips, lightheadedness, or anxiety may  occur.  Call your doctor right away if you think you may be experiencing any of these sensations, or if you have other questions regarding possible side effects.  Follow all other discharge instructions given to you by your surgeon or nurse. Eat a healthy diet and drink plenty of water or other fluids.  If you return to the hospital for any reason within 96 hours following the administration of EXPAREL, it is important for health care providers to know that you have received this anesthetic. A teal colored band has been placed on your arm with the date, time and amount of EXPAREL you have received in order to alert and inform your health care providers. Please leave this armband in place for the full 96 hours following administration, and then you may remove the band.

## 2019-03-05 NOTE — Interval H&P Note (Signed)
History and Physical Interval Note:  03/05/2019 7:28 AM  Shannon Patterson  has presented today for surgery, with the diagnosis of INTERNAL HEMROIDS.  The various methods of treatment have been discussed with the patient and family. After consideration of risks, benefits and other options for treatment, the patient has consented to  Procedure(s) with comments: HEMORRHOIDECTOMY (N/A) - PROPOFOL as a surgical intervention.  The patient's history has been reviewed, patient examined, no change in status, stable for surgery.  I have reviewed the patient's chart and labs.  Questions were answered to the patient's satisfaction.     [place]

## 2019-03-05 NOTE — Anesthesia Postprocedure Evaluation (Signed)
Anesthesia Post Note  Patient: Shannon Patterson  Procedure(s) Performed: HEMORRHOIDECTOMY (N/A )  Patient location during evaluation: PACU Anesthesia Type: General Level of consciousness: awake and alert Pain management: pain level controlled Respiratory status: spontaneous breathing Cardiovascular status: stable Anesthetic complications: no    Zaryia Markel, III,  Cyree Chuong D

## 2019-03-06 ENCOUNTER — Encounter: Payer: Self-pay | Admitting: Surgery

## 2019-03-09 ENCOUNTER — Telehealth: Payer: Self-pay | Admitting: *Deleted

## 2019-03-09 NOTE — Telephone Encounter (Signed)
Patient called and stated that she had hemorrhoidectomy on 03/05/19 by Dr.Pabon and she is doing fine other than there is "something" prutruding that she thinks should not be there. Please call and advise

## 2019-03-09 NOTE — Telephone Encounter (Signed)
Patient states there is something protruding from her anus. She states she is doing great, no pain. I spoke with Dr.Pabon and its most likely a hemorrhoid and she can gently try to push it back in or leave alone until the swelling goes down.

## 2019-03-10 LAB — SURGICAL PATHOLOGY

## 2019-03-19 ENCOUNTER — Ambulatory Visit (INDEPENDENT_AMBULATORY_CARE_PROVIDER_SITE_OTHER): Payer: 59 | Admitting: Surgery

## 2019-03-19 ENCOUNTER — Other Ambulatory Visit: Payer: Self-pay

## 2019-03-19 ENCOUNTER — Encounter: Payer: Self-pay | Admitting: Surgery

## 2019-03-19 VITALS — BP 176/116 | HR 77 | Temp 98.1°F | Resp 16 | Ht 64.0 in | Wt 151.8 lb

## 2019-03-19 DIAGNOSIS — K642 Third degree hemorrhoids: Secondary | ICD-10-CM

## 2019-03-19 NOTE — Patient Instructions (Addendum)
The patient is aware to call back for any questions or new concerns. Recommend sitz bath after having a BM Referral to colorectal surgery Dr Leodis Binet for [place]  How to Take a Sitz Bath A sitz bath is a warm water bath that may be used to care for your rectum, genital area, or the area between your rectum and genitals (perineum). For a sitz bath, the water only comes up to your hips and covers your buttocks. A sitz bath may done at home in a bathtub or with a portable sitz bath that fits over the toilet. Your health care provider may recommend a sitz bath to help:  Relieve pain and discomfort after delivering a baby.  Relieve pain and itching from hemorrhoids or anal fissures.  Relieve pain after certain surgeries.  Relax muscles that are sore or tight. How to take a sitz bath Take 3-4 sitz baths a day, or as many as told by your health care provider. Bathtub sitz bath To take a sitz bath in a bathtub: 1. Partially fill a bathtub with warm water. The water should be deep enough to cover your hips and buttocks when you are sitting in the tub. 2. If your health care provider told you to put medicine in the water, follow his or her instructions. 3. Sit in the water. 4. Open the tub drain a little, and leave it open during your bath. 5. Turn on the warm water again, enough to replace the water that is draining out. Keep the water running throughout your bath. This helps keep the water at the right level and the right temperature. 6. Soak in the water for 15-20 minutes, or as long as told by your health care provider. 7. When you are done, be careful when you stand up. You may feel dizzy. 8. After the sitz bath, pat yourself dry. Do not rub your skin to dry it.  Over-the-toilet sitz bath To take a sitz bath with an over-the-toilet basin: 1. Follow the manufacturer's instructions. 2. Fill the basin with warm water. 3. If your health  care provider told you to put medicine in the water, follow his or her instructions. 4. Sit on the seat. Make sure the water covers your buttocks and perineum. 5. Soak in the water for 15-20 minutes, or as long as told by your health care provider. 6. After the sitz bath, pat yourself dry. Do not rub your skin to dry it. 7. Clean and dry the basin between uses. 8. Discard the basin if it cracks, or according to the manufacturer's instructions. Contact a health care provider if:  Your symptoms get worse. Do not continue with sitz baths if your symptoms get worse.  You have new symptoms. If this happens, do not continue with sitz baths until you talk with your health care provider. Summary  A sitz bath is a warm water bath in which the water only comes up to your hips and covers your buttocks.  A sitz bath may help relieve itching, relieve pain, and relax muscles that are sore or tight in the lower part of your body, including your genital area.  Take 3-4 sitz baths a day, or as many as told by your health care provider. Soak in the water for 15-20 minutes.  Do not continue with sitz baths if your symptoms get worse. This information is not intended to replace advice given to you by your health care provider. Make sure you discuss  any questions you have with your health care provider. Document Released: 06/30/2004 Document Revised: 10/10/2017 Document Reviewed: 10/10/2017 Elsevier Interactive Patient Education  2019 [employer].

## 2019-03-19 NOTE — Progress Notes (Signed)
Outpatient Surgical Follow Up  03/19/2019  Shannon Patterson is an 53 y.o. female.   Chief Complaint  Patient presents with  . Routine Post Op    Hemorrhoidectomy 03/05/2019    HPI: s/p hemorrhoidectomy. Doing well, some pain after BM, significantly improvement on anorectal discomfort. No fevers . Path reviewed w patients High Grade squamous Intraepithelial lesion. Incidental findings will need anoscopy and surveillance by colorectal Surgeon.  Past Medical History:  Diagnosis Date  . Anxiety   . History of Papanicolaou smear of cervix 08/01/2012   -/-  . Hyperlipidemia   . Hyperthyroidism   . Internal hemorrhoid, bleeding 02/21/2018  . SOB (shortness of breath) 09/28/2015  . SVT (supraventricular tachycardia) ([place])   . VBAC (vaginal birth after Cesarean) 1996    Past Surgical History:  Procedure Laterality Date  . ABDOMINAL HYSTERECTOMY  ~2000   HAS OVARIES/CX; PJR  . CESAREAN SECTION  1991   FTP; PJR  . COLONOSCOPY WITH PROPOFOL N/A 01/30/2018   Procedure: COLONOSCOPY WITH PROPOFOL;  Surgeon: Jonathon Bellows, MD;  Location: [facility] ENDOSCOPY;  Service: Gastroenterology;  Laterality: N/A;  . 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 ESOPHAGOGASTRODUODENOSCOPY (EGD) WITH PROPOFOL N/A 01/30/2018   Procedure: ESOPHAGOGASTRODUODENOSCOPY (EGD) WITH PROPOFOL;  Surgeon: Jonathon Bellows, MD;  Location: [facility] ENDOSCOPY;  Service: Gastroenterology;  Laterality: N/A;  . HEMORRHOID SURGERY N/A 03/05/2019   Procedure: HEMORRHOIDECTOMY;  Surgeon: Jules Husbands, MD;  Location: [place];  Service: General;  Laterality: N/A;  PROPOFOL  . HYSTEROSALPINGOGRAM  02/04/2009  . THYROIDECTOMY     PT WAS IN HER 20s    Family History  Problem Relation Age of Onset  . Breast cancer Mother 70  . Cancer Mother   . Hypertension Mother   . Colon polyps Father   . Breast cancer Maternal Grandmother 80  . Cancer Maternal Grandmother     Social History:  reports that she has  been smoking. She has a 19.00 pack-year smoking history. She has never used smokeless tobacco. She reports current alcohol use of about 7.0 standard drinks of alcohol per week. She reports that she does not use drugs.  Allergies:  Allergies  Allergen Reactions  . Lidocaine Other (See Comments)    tachycardia  . Prednisone     Other reaction(s): Very emotional -   . Procaine     Medications reviewed.    ROS Full ROS performed and is otherwise negative other than what is stated in HPI   BP (!) 176/116   Pulse 77   Temp 98.1 F (36.7 C) (Temporal)   Resp 16   Ht 5\' 4"  (1.626 m)   Wt 151 lb 12.8 oz (68.9 kg)   LMP  (LMP Unknown)   SpO2 98%   BMI 26.06 kg/m   Physical Exam NAD, alert No evidence of perineal sepsis, Left lateral hemorrhoid. Hemorrhoidectomy sites healing well    Assessment/Plan:  1. Grade III hemorrhoids  - Ambulatory referral to Colorectal Surgery for path findings RTC prn       Caroleen Hamman, MD [facility] General Surgeon

## 2019-03-24 ENCOUNTER — Telehealth: Payer: Self-pay | Admitting: *Deleted

## 2019-03-24 NOTE — Telephone Encounter (Signed)
Notified Dr Audie Clear office of referral, notes received, they will contact pt to schedule an appointment.

## 2019-11-17 ENCOUNTER — Ambulatory Visit
Admission: RE | Admit: 2019-11-17 | Discharge: 2019-11-17 | Disposition: A | Discharge status: Home / Self Care | Payer: 59 | Source: Ambulatory Visit | Attending: Obstetrics and Gynecology | Admitting: Obstetrics and Gynecology

## 2019-11-17 ENCOUNTER — Other Ambulatory Visit: Payer: Self-pay

## 2019-11-17 DIAGNOSIS — Z1231 Encounter for screening mammogram for malignant neoplasm of breast: Secondary | ICD-10-CM | POA: Insufficient documentation

## 2019-11-17 DIAGNOSIS — R921 Mammographic calcification found on diagnostic imaging of breast: Secondary | ICD-10-CM | POA: Insufficient documentation

## 2019-11-17 DIAGNOSIS — Z1239 Encounter for other screening for malignant neoplasm of breast: Secondary | ICD-10-CM

## 2019-11-18 ENCOUNTER — Other Ambulatory Visit: Payer: Self-pay | Admitting: Obstetrics and Gynecology

## 2019-11-18 DIAGNOSIS — R921 Mammographic calcification found on diagnostic imaging of breast: Secondary | ICD-10-CM

## 2019-11-18 DIAGNOSIS — R928 Other abnormal and inconclusive findings on diagnostic imaging of breast: Secondary | ICD-10-CM

## 2019-11-20 ENCOUNTER — Ambulatory Visit
Admission: RE | Admit: 2019-11-20 | Discharge: 2019-11-20 | Disposition: A | Discharge status: Home / Self Care | Payer: 59 | Source: Ambulatory Visit | Attending: Obstetrics and Gynecology | Admitting: Obstetrics and Gynecology

## 2019-11-20 DIAGNOSIS — R921 Mammographic calcification found on diagnostic imaging of breast: Secondary | ICD-10-CM | POA: Diagnosis present

## 2019-11-20 DIAGNOSIS — R928 Other abnormal and inconclusive findings on diagnostic imaging of breast: Secondary | ICD-10-CM

## 2019-12-17 ENCOUNTER — Other Ambulatory Visit: Payer: Self-pay

## 2019-12-17 ENCOUNTER — Other Ambulatory Visit (HOSPITAL_COMMUNITY)
Admission: RE | Admit: 2019-12-17 | Discharge: 2019-12-17 | Disposition: A | Discharge status: Home / Self Care | Payer: 59 | Source: Ambulatory Visit | Attending: Obstetrics and Gynecology | Admitting: Obstetrics and Gynecology

## 2019-12-17 ENCOUNTER — Ambulatory Visit (INDEPENDENT_AMBULATORY_CARE_PROVIDER_SITE_OTHER): Payer: 59 | Admitting: Obstetrics and Gynecology

## 2019-12-17 ENCOUNTER — Encounter: Payer: Self-pay | Admitting: Obstetrics and Gynecology

## 2019-12-17 VITALS — BP 163/108 | HR 101 | Ht 64.0 in | Wt 151.0 lb

## 2019-12-17 DIAGNOSIS — Z1331 Encounter for screening for depression: Secondary | ICD-10-CM | POA: Diagnosis not present

## 2019-12-17 DIAGNOSIS — Z1339 Encounter for screening examination for other mental health and behavioral disorders: Secondary | ICD-10-CM

## 2019-12-17 DIAGNOSIS — Z90711 Acquired absence of uterus with remaining cervical stump: Secondary | ICD-10-CM | POA: Diagnosis not present

## 2019-12-17 DIAGNOSIS — Z01419 Encounter for gynecological examination (general) (routine) without abnormal findings: Secondary | ICD-10-CM | POA: Diagnosis not present

## 2019-12-17 DIAGNOSIS — Z124 Encounter for screening for malignant neoplasm of cervix: Secondary | ICD-10-CM | POA: Insufficient documentation

## 2019-12-17 NOTE — Progress Notes (Signed)
Gynecology Annual Exam  PCP: Juline Patch, MD  Chief Complaint  Patient presents with  . Gynecologic Exam   History of Present Illness:  Ms. Shannon Patterson is a 54 y.o. [phone] who LMP was No LMP recorded (lmp unknown). Patient has had a hysterectomy., presents today for her annual examination.  Her menses are absent due to supracervical  hysterectomy (still has ovaries).   She does have vasomotor sx. She used the topical estrogen patch for several years.  She believes she has been going through menopause for about 5 years.  She is infrequently sexually active. She does not have vaginal dryness.  Last Pap: 12/2016  Results were: no abnormalities /neg HPV DNA.  Hx of STDs: none  Last mammogram: 11/20/2019  Results were: BiRads 1 (initial screening mammogram was Birads 0, diagnostic was Birads 1). Recommended follow up 1 year for screening There is a FH of breast cancer in her mother and maternal grandmother. There is no FH of ovarian cancer. The patient does not do self-breast exams.  Colonoscopy: 01/2018 - 3 polyps - due in 2022  Tobacco use: smokes 1 ppd x 19-20 years Alcohol use: social drinker Exercise: moderately active  The patient wears seatbelts: yes.     Past Medical History:  Diagnosis Date  . Anxiety   . History of Papanicolaou smear of cervix 08/01/2012   -/-  . Hyperlipidemia   . Hyperthyroidism   . Internal hemorrhoid, bleeding 02/21/2018  . SOB (shortness of breath) 09/28/2015  . SVT (supraventricular tachycardia) ([place])   . VBAC (vaginal birth after Cesarean) 1996    Past Surgical History:  Procedure Laterality Date  . ABDOMINAL HYSTERECTOMY  ~2000   HAS OVARIES/CX; PJR  . CESAREAN SECTION  1991   FTP; PJR  . COLONOSCOPY WITH PROPOFOL N/A 01/30/2018   Procedure: COLONOSCOPY WITH PROPOFOL;  Surgeon: Jonathon Bellows, MD;  Location[facility] ENDOSCOPY;  Service: Gastroenterology;  Laterality: N/A;  . ENDOMETRIAL BIOPSY  10/19/2009   PJR; SIMPLE HYPERPLASIA W/O  ATYPIA  . ESOPHAGOGASTRODUODENOSCOPY (EGD) WITH PROPOFOL N/A 01/30/2018   Procedure: ESOPHAGOGASTRODUODENOSCOPY (EGD) WITH PROPOFOL;  Surgeon: Jonathon Bellows, MD;  Location: [facility] ENDOSCOPY;  Service: Gastroenterology;  Laterality: N/A;  . HEMORRHOID SURGERY N/A 03/05/2019   Procedure: HEMORRHOIDECTOMY;  Surgeon: Jules Husbands, MD;  Location: [place];  Service: General;  Laterality: N/A;  PROPOFOL  . HYSTEROSALPINGOGRAM  02/04/2009  . THYROIDECTOMY     PT WAS IN HER 20s    Prior to Admission medications   Medication Sig Start Date End Date Taking? Authorizing Provider  EPINEPHrine 0.3 mg/0.3 mL IJ SOAJ injection INJECT 0.3 MLS INTO MUSCLE ONCE. TAKE FOR SEVERE ALLERGIC REACTION, THEN COME TO ER OR CALL 911 06/14/18   [provider]  levothyroxine (SYNTHROID) 100 MCG tablet Take 100 mcg by mouth daily. [place] 09/09/15   [provider]  LORazepam (ATIVAN) 0.5 MG tablet Take 0.5 mg by mouth every 8 (eight) hours. [place]    [provider]  metoprolol succinate (TOPROL-XL) 50 MG 24 hr tablet Take 50 mg by mouth daily. Take with or immediately following a meal.    [provider]  polyethylene glycol (MIRALAX) packet Take 17 g by mouth daily. 01/30/18   Jonathon Bellows, MD  rosuvastatin (CRESTOR) 10 MG tablet Take 10 mg by mouth daily. 09/27/15   [provider]  flecainide (TAMBOCOR) 50 MG tablet Take 50 mg by mouth 2 (two) times daily. 09/28/15 01/24/18  [provider]  Allergies  Allergen Reactions  . Lidocaine Other (See Comments)    tachycardia  . Prednisone     Other reaction(s): Very emotional -   . Procaine    Obstetric History: [id number], s/p c-section and VBAC  Family History  Problem Relation Age of Onset  . Breast cancer Mother 58  . Cancer Mother   . Hypertension Mother   . Colon polyps Father   . Breast cancer Maternal Grandmother 80  . Cancer Maternal Grandmother     Social History   Socioeconomic  History  . Marital status: Married    Spouse name: Not on file  . Number of children: Not on file  . Years of education: Not on file  . Highest education level: Not on file  Occupational History  . Not on file  Tobacco Use  . Smoking status: Current Every Day Smoker    Packs/day: 1.00    Years: 19.00    Pack years: 19.00  . Smokeless tobacco: Never Used  . Tobacco comment: Started age 57, (quit for 4 yrs), smoked last 10 yrs  Substance and Sexual Activity  . Alcohol use: Yes    Alcohol/week: 7.0 standard drinks    Types: 7 Standard drinks or equivalent per week  . Drug use: No  . Sexual activity: Yes    Birth control/protection: Surgical  Other Topics Concern  . Not on file  Social History Narrative  . Not on file   Social Determinants of Health   Financial Resource Strain:   . Difficulty of Paying Living Expenses: Not on file  Food Insecurity:   . Worried About Charity fundraiser in the Last Year: Not on file  . Ran Out of Food in the Last Year: Not on file  Transportation Needs:   . Lack of Transportation (Medical): Not on file  . Lack of Transportation (Non-Medical): Not on file  Physical Activity:   . Days of Exercise per Week: Not on file  . Minutes of Exercise per Session: Not on file  Stress:   . Feeling of Stress : Not on file  Social Connections:   . Frequency of Communication with Friends and Family: Not on file  . Frequency of Social Gatherings with Friends and Family: Not on file  . Attends Religious Services: Not on file  . Active Member of Clubs or Organizations: Not on file  . Attends Archivist Meetings: Not on file  . Marital Status: Not on file  Intimate Partner Violence:   . Fear of Current or Ex-Partner: Not on file  . Emotionally Abused: Not on file  . Physically Abused: Not on file  . Sexually Abused: Not on file    Review of Systems  Constitutional: Negative.   HENT: Negative.   Eyes: Negative.   Respiratory: Negative.    Cardiovascular: Negative.   Gastrointestinal: Negative.   Genitourinary: Negative.   Musculoskeletal: Negative.   Skin: Negative.   Neurological: Negative.   Psychiatric/Behavioral: Negative.       Physical Exam BP (!) 163/108   Pulse (!) 101   Ht 5\' 4"  (1.626 m)   Wt 151 lb (68.5 kg)   LMP  (LMP Unknown)   BMI 25.92 kg/m   Physical Exam Constitutional:      General: She is not in acute distress.    Appearance: Normal appearance. She is well-developed.  Genitourinary:     Pelvic exam was performed with patient in the lithotomy position.     Vulva,  urethra and bladder normal.     No inguinal adenopathy present in the right or left side.    No signs of injury in the vagina.     No vaginal discharge, erythema, tenderness or bleeding.     No cervical motion tenderness, discharge, lesion or polyp.     Uterus is absent.     No right or left adnexal mass present.     Right adnexa not tender or full.     Left adnexa not tender or full.  HENT:     Head: Normocephalic and atraumatic.  Eyes:     General: No scleral icterus.    Conjunctiva/sclera: Conjunctivae normal.  Neck:     Thyroid: No thyromegaly.  Cardiovascular:     Rate and Rhythm: Normal rate and regular rhythm.     Heart sounds: No murmur. No friction rub. No gallop.   Pulmonary:     Effort: Pulmonary effort is normal. No respiratory distress.     Breath sounds: Normal breath sounds. No wheezing or rales.  Chest:     Breasts:        Right: No inverted nipple, mass, nipple discharge, skin change or tenderness.        Left: No inverted nipple, mass, nipple discharge, skin change or tenderness.  Abdominal:     General: Bowel sounds are normal. There is no distension.     Palpations: Abdomen is soft. There is no mass.     Tenderness: There is no abdominal tenderness. There is no guarding or rebound.  Musculoskeletal:        General: No swelling or tenderness. Normal range of motion.     Cervical back: Normal range  of motion and neck supple.  Lymphadenopathy:     Cervical: No cervical adenopathy.     Lower Body: No right inguinal adenopathy. No left inguinal adenopathy.  Neurological:     General: No focal deficit present.     Mental Status: She is alert and oriented to person, place, and time.     Cranial Nerves: No cranial nerve deficit.  Skin:    General: Skin is warm and dry.     Findings: No erythema or rash.  Psychiatric:        Mood and Affect: Mood normal.        Behavior: Behavior normal.        Judgment: Judgment normal.     Female chaperone present for pelvic and breast  portions of the physical exam  Results: AUDIT Questionnaire (screen for alcoholism): 4 PHQ-9: 0  Assessment: 54 y.o. [id number] here for routine gynecologic annual examination  Plan: Problem List Items Addressed This Visit    None    Visit Diagnoses    Women's annual routine gynecological examination    -  Primary   Relevant Orders   Cytology - PAP   Screening for depression       Screening for alcoholism       Pap smear for cervical cancer screening       Relevant Orders   Cytology - PAP      Screening: -- Blood pressure screen elevated. Needs to see PCP for BP issues -- Colonoscopy - not due -- Mammogram - not due -- Weight screening: normal -- Depression screening negative (PHQ-9) -- Nutrition: normal -- cholesterol screening: per PCP -- osteoporosis screening: not due -- tobacco screening: using: discussed quitting using the 5 A's -- alcohol screening: AUDIT questionnaire indicates low-risk usage. -- family history  of breast cancer screening: done. not at high risk.  -- no evidence of domestic violence or intimate partner violence. -- STD screening: gonorrhea/chlamydia NAAT not collected per patient request. -- pap smear collected per ASCCP guidelines -- flu vaccine declined -- HPV vaccination series: not eligilbe  Prentice Docker, MD 12/17/2019 2:55 PM

## 2019-12-23 LAB — CYTOLOGY - PAP
Comment: NEGATIVE
Comment: NEGATIVE
Comment: NEGATIVE
Diagnosis: NEGATIVE
HPV 16: NEGATIVE
HPV 18 / 45: NEGATIVE
High risk HPV: POSITIVE — AB

## 2019-12-25 ENCOUNTER — Telehealth: Payer: Self-pay | Admitting: Obstetrics and Gynecology

## 2019-12-25 NOTE — Telephone Encounter (Signed)
Discussed pap smear result of NILM, HPV + (negative HPV 16 and 18).  Discussed that based on prior pap smear results of NILM, HPV negative in 2018, the ASCCP recommended follow up is to repeat the pap smear in one year.  I underscored the importance of follow up to prevent cancer of the cervix.  She voiced understanding and agreement to follow up.

## 2020-01-21 ENCOUNTER — Other Ambulatory Visit: Payer: Self-pay

## 2020-01-21 ENCOUNTER — Ambulatory Visit
Admission: EM | Admit: 2020-01-21 | Discharge: 2020-01-21 | Disposition: A | Discharge status: Home / Self Care | Payer: 59 | Attending: Urgent Care | Admitting: Urgent Care

## 2020-01-21 ENCOUNTER — Ambulatory Visit (INDEPENDENT_AMBULATORY_CARE_PROVIDER_SITE_OTHER): Payer: 59

## 2020-01-21 DIAGNOSIS — S63502A Unspecified sprain of left wrist, initial encounter: Secondary | ICD-10-CM

## 2020-01-21 DIAGNOSIS — M25532 Pain in left wrist: Secondary | ICD-10-CM

## 2020-01-21 DIAGNOSIS — W57XXXA Bitten or stung by nonvenomous insect and other nonvenomous arthropods, initial encounter: Secondary | ICD-10-CM | POA: Diagnosis not present

## 2020-01-21 DIAGNOSIS — M1812 Unilateral primary osteoarthritis of first carpometacarpal joint, left hand: Secondary | ICD-10-CM | POA: Diagnosis not present

## 2020-01-21 DIAGNOSIS — L237 Allergic contact dermatitis due to plants, except food: Secondary | ICD-10-CM

## 2020-01-21 HISTORY — DX: Unilateral primary osteoarthritis of first carpometacarpal joint, left hand: M18.12

## 2020-01-21 MED ORDER — MELOXICAM 15 MG PO TABS: 15.0000 mg | ORAL_TABLET | Freq: Every day | ORAL | 0 refills | Status: DC

## 2020-01-21 MED ORDER — TRIAMCINOLONE ACETONIDE 0.1 % EX CREA: 1.0000 "application " | TOPICAL_CREAM | Freq: Two times a day (BID) | CUTANEOUS | 0 refills | Status: AC

## 2020-01-21 NOTE — Discharge Instructions (Addendum)
It was very nice seeing you today in clinic. Thank you for entrusting me with your care.   REST, ICE, and ELEVATE wrist. Wear brace for comfort and support. Use anti-inflammatory as prescribed. You advised that you could not use oral steroids. Will send in topical version for you to apply to your bites and dermatitis.   Make arrangements to follow up with your regular doctor in 1 week for re-evaluation if not improving. If your symptoms/condition worsens, please seek follow up care either here or in the ER. Please remember, our [place] providers are "right here with you" when you need [place].   Again, it was my pleasure to take care of you today. Thank you for choosing our clinic. I hope that you start to feel better quickly.   Honor Loh, MSN, APRN, FNP-C, CEN Advanced Practice Provider [facility]

## 2020-01-21 NOTE — ED Provider Notes (Signed)
[place], [place]   Name: Shannon Patterson DOB: 1966-04-23 MRN: [id number] CSN: [id number] PCP: Juline Patch, MD  Arrival date and time:  01/21/20 1035  Chief Complaint:  Wrist Injury and Poison Ivy  NOTE: Prior to seeing the patient today, I have reviewed the triage nursing documentation and vital signs. Clinical staff has updated patient's PMH/PSHx, current medication list, and drug allergies/intolerances to ensure comprehensive history available to assist in medical decision making.   History:   HPI: Shannon Patterson is a 54 y.o. female who presents today with multiple medical complaints as follows:  INSECT BITES Patient presents with complaints of multiple insect bites to her anterior chest wall (beneath breasts; bra line). Patient reports that she has been working outside more as of late and on Saturday (01/16/20) some sore of insect got into her sports bra and bit her multiple times. Areas reported to be erythematous and pruritic. She denies drainage or associated warmth. No fevers or chills. Areas have already started to improve and patient is not concerned infection at this time.   POISON IVY Patient also with complaints of a rash to her BILATERAL upper extremities that initially declared approximately 6 days ago. Rash started on her arm and has remained fairly localized for the most part. Patient advising that she was working outside and inadvertently came into contact with poison ivy. Rash is erythematous and pruritic. She has not appreciated any drainage associated with the rash. There is no facial involvement; no rash to periorbital, paranasal, or perioral areas. Patient denies that she is not experiencing any shortness of breath or sensation of pharyngeal/laryngeal fullness. Despite her symptoms, patient has not taken any over the counter interventions to help improve/relieve her reported symptoms at home.   WRIST PAIN Lastly, patient presents with pain in her LEFT wrist following a  mechanical fall last night. Patient reports that she was roller skating. She describes her injury to have occurred when she sustained a [place]. She did not hit her head when she fell. She denies any pain in her neck or back. Patient has FROM and sensation in the wrist and hand; movements are painful. There is some mild associated swelling. Patient describes some minor tingling in her fingers. She denies previous injuries/surgeries to her wrist/hand. In efforts to conservatively manage her symptoms at home, the patient notes that she has used IBU, which has helped to improve her symptoms some.   Past Medical History:  Diagnosis Date  . Anxiety   . History of Papanicolaou smear of cervix 08/01/2012   -/-  . Hyperlipidemia   . Hyperthyroidism   . Internal hemorrhoid, bleeding 02/21/2018  . SOB (shortness of breath) 09/28/2015  . SVT (supraventricular tachycardia) ([place])   . VBAC (vaginal birth after Cesarean) 1996    Past Surgical History:  Procedure Laterality Date  . ABDOMINAL HYSTERECTOMY  ~2000   HAS OVARIES/CX; PJR  . CESAREAN SECTION  1991   FTP; PJR  . COLONOSCOPY WITH PROPOFOL N/A 01/30/2018   Procedure: COLONOSCOPY WITH PROPOFOL;  Surgeon: Jonathon Bellows, MD;  Location: [facility] ENDOSCOPY;  Service: Gastroenterology;  Laterality: N/A;  . 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 ESOPHAGOGASTRODUODENOSCOPY (EGD) WITH PROPOFOL N/A 01/30/2018   Procedure: ESOPHAGOGASTRODUODENOSCOPY (EGD) WITH PROPOFOL;  Surgeon: Jonathon Bellows, MD;  Location: [facility] ENDOSCOPY;  Service: Gastroenterology;  Laterality: N/A;  . HEMORRHOID SURGERY N/A 03/05/2019   Procedure: HEMORRHOIDECTOMY;  Surgeon: Jules Husbands, MD;  Location: [facility];  Service:  General;  Laterality: N/A;  PROPOFOL  . HYSTEROSALPINGOGRAM  02/04/2009  . THYROIDECTOMY     PT WAS IN HER 20s    Family History  Problem Relation Age of Onset  . Breast cancer Mother 2  . Cancer Mother   . Hypertension Mother    . Colon polyps Father   . Breast cancer Maternal Grandmother 80  . Cancer Maternal Grandmother     Social History   Tobacco Use  . Smoking status: Current Every Day Smoker    Packs/day: 1.00    Years: 19.00    Pack years: 19.00  . Smokeless tobacco: Never Used  . Tobacco comment: Started age 34, (quit for 35 yrs), smoked last 10 yrs  Substance Use Topics  . Alcohol use: Yes    Alcohol/week: 7.0 standard drinks    Types: 7 Standard drinks or equivalent per week  . Drug use: No    Patient Active Problem List   Diagnosis Date Noted  . Grade III hemorrhoids   . Anxiety 03/21/2017  . Current smoker 03/21/2017  . Elevated LDL cholesterol level 03/21/2017  . Generalized abdominal pain 03/21/2017  . Hypothyroidism, postop 03/21/2017  . Pain in joint, multiple sites 03/21/2017  . Atypical chest pain 09/28/2015  . SVT (supraventricular tachycardia) ([place]) 09/28/2015  . Peroneal tendinitis of left lower extremity 03/30/2015  . Stress fracture of metatarsal bone of left foot 02/09/2015    Home Medications:    Current Meds  Medication Sig  . EPINEPHrine 0.3 mg/0.3 mL IJ SOAJ injection INJECT 0.3 MLS INTO MUSCLE ONCE. TAKE FOR SEVERE ALLERGIC REACTION, THEN COME TO ER OR CALL 911  . levothyroxine (SYNTHROID) 112 MCG tablet Take 112 mcg by mouth daily before breakfast.  . LORazepam (ATIVAN) 0.5 MG tablet Take 0.5 mg by mouth every 8 (eight) hours. Morayarti  . metoprolol succinate (TOPROL-XL) 50 MG 24 hr tablet Take 50 mg by mouth daily. Take with or immediately following a meal.  . polyethylene glycol (MIRALAX) packet Take 17 g by mouth daily.  . rosuvastatin (CRESTOR) 10 MG tablet Take 10 mg by mouth daily.    Allergies:   Lidocaine, Prednisone, and Procaine  Review of Systems (ROS):  Review of systems NEGATIVE unless otherwise noted in narrative H&P section.   Vital Signs: Today's Vitals   01/21/20 1050 01/21/20 1052 01/21/20 1157  BP:  (!) 156/108   Pulse:  82     Resp:  18   Temp:  98.4 F (36.9 C)   TempSrc:  Oral   SpO2:  100%   Weight: 150 lb (68 kg)    Height: 5\' 4"  (1.626 m)    PainSc: 8   8     Physical Exam: Physical Exam  Constitutional: She is oriented to person, place, and time and well-developed, well-nourished, and in no distress.  HENT:  Head: Normocephalic and atraumatic.  Eyes: Pupils are equal, round, and reactive to light.  Cardiovascular: Normal rate and intact distal pulses.  Pulmonary/Chest: Effort normal. No respiratory distress.  Musculoskeletal:     Left wrist: Swelling and tenderness present. No deformity, effusion or crepitus. Normal range of motion.     Left hand: No swelling or tenderness. Normal range of motion. Normal strength. Normal sensation. There is no disruption of two-point discrimination. Normal capillary refill.     Cervical back: Full passive range of motion without pain.  Neurological: She is alert and oriented to person, place, and time. Gait normal.  Skin: Skin is warm and  dry. Rash noted. She is not diaphoretic.  Erythematous maculopapular rash to BUE. Erythematous insect bites to anterior chest wall; no drainage.   Psychiatric: Mood, memory, affect and judgment normal.  Nursing note and vitals reviewed.   Urgent Care Treatments / Results:   Orders Placed This Encounter  Procedures  . Wrist splint  . DG Wrist Complete Left    LABS: PLEASE NOTE: all labs that were ordered this encounter are listed, however only abnormal results are displayed. Labs Reviewed - No data to display  EKG: -None  RADIOLOGY: DG Wrist Complete Left  Result Date: 01/21/2020 CLINICAL DATA:  Painful range of motion after fall onto outstretched hand while skating. Injury yesterday. EXAM: LEFT WRIST - COMPLETE 3+ VIEW COMPARISON:  None. FINDINGS: There is no evidence of fracture or dislocation. Scaphoid is intact. Osteoarthritis at the thumb carpal metacarpal joint with joint space narrowing, subchondral cystic  change of bony fragmentation. Generalized soft tissue edema. IMPRESSION: 1. Soft tissue edema without acute fracture or dislocation. 2. 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 Electronically Signed   By: Keith Rake M.D.   On: 01/21/2020 11:15    PROCEDURES: Procedures  MEDICATIONS RECEIVED THIS VISIT: Medications - No data to display  PERTINENT CLINICAL COURSE NOTES/UPDATES:   Initial Impression / Assessment and Plan / Urgent Care Course:  Pertinent labs & imaging results that were available during my care of the patient were personally reviewed by me and considered in my medical decision making (see lab/imaging section of note for values and interpretations).  PHELAN NITSCH is a 54 y.o. female who presents to [facility] today with complaints of Wrist Injury and Poison Ivy  Patient is well appearing overall in clinic today. She does not appear to be in any acute distress. Presenting symptoms (see HPI) and exam as documented above. Patient presents with multiple medical complaints. Will address as follows:  WRIST PAIN Patient with LEFT wrist pain s/p Economy injury sustained yesterday. Grossly NVI with no deformities. Diagnostic radiographs revealed no acute abnormalities; no fracture, dislocation, or effusion. There is OA noted at the [facility] joint of the thumb. Suspect sprain. Will place patient in wrist splint for support. Prescription sent in for meloxicam to help with acute pain in the wrist, as well as chronic pain in the thumb area.  She was educated on complimentary modalities to help with her pain. Patient encouraged to rest, ice, and elevate wrist. Ice should be applied TID-QID for 15-20 minutes at a time. If not improving in 1 week, will need to see PCP or orthopedics for re-evaluation and repeat imaging to determine presence of possible occult fracture.   SKIN Patient with erythematous rash to BUE consistent with contact dermatitis related to recent poison ivy  exposure. Additionally, she has non-infected insect bites to her anterior chest wall (beneath breasts). Discussed intended treatment with systemic steroid course, however patient declined citing that oral steroids cause her extreme mood instability. Will alter plans and treat with topical 0.1% TAC. Patient to keep areas clean and dry. She also to monitor for signs and symptoms of infection, which would include increased redness, swelling, streaking, drainage, pain, and the development of a fever.   Discussed follow up with primary care physician in 1 week for re-evaluation. I have reviewed the follow up and strict return precautions for any new or worsening symptoms. Patient is aware of symptoms that would be deemed urgent/emergent, and would thus require further evaluation either here or in the emergency department. At  the time of discharge, she verbalized understanding and consent with the discharge plan as it was reviewed with her. All questions were fielded by provider and/or clinic staff prior to patient discharge.    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 Not Applicable  Meds ordered this encounter  Medications  . meloxicam (MOBIC) 15 MG tablet    Sig: Take 1 tablet (15 mg total) by mouth daily.    Dispense:  30 tablet    Refill:  0  . triamcinolone cream (KENALOG) 0.1 %    Sig: Apply 1 application topically 2 (two) times daily.    Dispense:  80 g    Refill:  0    Recommended Follow up Care:  Patient encouraged to follow up with the following provider within the specified time frame, or sooner as dictated by  the severity of her symptoms. As always, she was instructed that for any urgent/emergent care needs, she should seek care either here or in the emergency department for more immediate evaluation.  Follow-up Information    Juline Patch, MD In 1 week.   Specialty: Family Medicine Why: General reassessment of symptoms if not improving Contact information: [address] [place] [address] [phone]         NOTE: This note was prepared using Dragon dictation software along with smaller phrase technology. Despite my best ability to proofread, there is the potential that transcriptional errors may still occur from this process, and are completely unintentional.    Karen Kitchens, NP 01/22/20 1148

## 2020-01-21 NOTE — ED Triage Notes (Addendum)
Pt presents with c/o left wrist injury last night while roller skating. Pt states her wrist is very painful. Pt denies bruising but may have some mild swelling. Pt does have some decreased ROM in the wrist, has full ROM in all fingers.  Pt also has poison ivy and welts/rash under her bra line.

## 2020-01-22 ENCOUNTER — Encounter: Payer: Self-pay | Admitting: Urgent Care

## 2021-01-13 ENCOUNTER — Telehealth: Payer: Self-pay

## 2021-01-13 NOTE — Telephone Encounter (Signed)
Patient is scheduled for 02/06/21 with SDJ. Patient requesting order for mammogram

## 2021-01-13 NOTE — Telephone Encounter (Signed)
Pt calling needing order for mammo, over due for annual . Can you please schedule this for pt?

## 2021-01-16 ENCOUNTER — Other Ambulatory Visit: Payer: Self-pay | Admitting: Obstetrics and Gynecology

## 2021-01-16 DIAGNOSIS — Z1231 Encounter for screening mammogram for malignant neoplasm of breast: Secondary | ICD-10-CM

## 2021-01-16 NOTE — Telephone Encounter (Signed)
Pt aware via vm 

## 2021-01-30 ENCOUNTER — Other Ambulatory Visit: Payer: Self-pay

## 2021-01-30 ENCOUNTER — Telehealth (INDEPENDENT_AMBULATORY_CARE_PROVIDER_SITE_OTHER): Payer: Self-pay | Admitting: Gastroenterology

## 2021-01-30 DIAGNOSIS — Z86010 Personal history of colonic polyps: Secondary | ICD-10-CM

## 2021-01-30 MED ORDER — NA SULFATE-K SULFATE-MG SULF 17.5-3.13-1.6 GM/177ML PO SOLN: 1.0000 | Freq: Once | ORAL | 0 refills | Status: AC

## 2021-01-30 NOTE — Progress Notes (Signed)
Gastroenterology Pre-Procedure Review  Request Date: Tuesday 02/07/21 Requesting Physician: Dr. Vicente Males  PATIENT REVIEW QUESTIONS: The patient responded to the following health history questions as indicated:    1. Are you having any GI issues? no 2. Do you have a personal history of Polyps? yes (colonoscopy performed by Dr. Vicente Males noteed polyps 02/08/18) 3. Do you have a family history of Colon Cancer or Polyps? yes (father colon polyps) 4. Diabetes Mellitus? no 5. Joint replacements in the past 12 months?no 6. Major health problems in the past 3 months?no 7. Any artificial heart valves, MVP, or defibrillator?no    MEDICATIONS & ALLERGIES:    Patient reports the following regarding taking any anticoagulation/antiplatelet therapy:   Plavix, Coumadin, Eliquis, Xarelto, Lovenox, Pradaxa, Brilinta, or Effient? no Aspirin? no  Patient confirms/reports the following medications:  Current Outpatient Medications  Medication Sig Dispense Refill  . EPINEPHrine 0.3 mg/0.3 mL IJ SOAJ injection INJECT 0.3 MLS INTO MUSCLE ONCE. TAKE FOR SEVERE ALLERGIC REACTION, THEN COME TO ER OR CALL 911  0  . fenofibrate (TRICOR) 145 MG tablet Take 145 mg by mouth daily.    Marland Kitchen levothyroxine (SYNTHROID) 112 MCG tablet Take 112 mcg by mouth daily before breakfast.    . LORazepam (ATIVAN) 0.5 MG tablet Take 0.5 mg by mouth every 8 (eight) hours. Morayarti    . metoprolol succinate (TOPROL-XL) 50 MG 24 hr tablet Take 50 mg by mouth daily. Take with or immediately following a meal.    . polyethylene glycol (MIRALAX) packet Take 17 g by mouth daily. 14 each 0  . rosuvastatin (CRESTOR) 10 MG tablet Take 10 mg by mouth daily.    . meloxicam (MOBIC) 15 MG tablet Take 1 tablet (15 mg total) by mouth daily. (Patient not taking: Reported on 01/30/2021) 30 tablet 0  . triamcinolone cream (KENALOG) 0.1 % Apply 1 application topically 2 (two) times daily. (Patient not taking: Reported on 01/30/2021) 80 g 0   No current  facility-administered medications for this visit.    Patient confirms/reports the following allergies:  Allergies  Allergen Reactions  . Lidocaine Other (See Comments)    tachycardia  . Prednisone     Other reaction(s): Very emotional -   . Procaine     No orders of the defined types were placed in this encounter.   AUTHORIZATION INFORMATION Primary Insurance: 1D#: Group #:  Secondary Insurance: 1D#: Group #:  SCHEDULE INFORMATION: Date: 02/07/21 Time: Location:[facility]

## 2021-02-06 ENCOUNTER — Other Ambulatory Visit: Payer: Self-pay

## 2021-02-06 ENCOUNTER — Encounter: Payer: Self-pay | Admitting: Obstetrics and Gynecology

## 2021-02-06 ENCOUNTER — Ambulatory Visit (INDEPENDENT_AMBULATORY_CARE_PROVIDER_SITE_OTHER): Payer: PRIVATE HEALTH INSURANCE | Admitting: Obstetrics and Gynecology

## 2021-02-06 ENCOUNTER — Other Ambulatory Visit (HOSPITAL_COMMUNITY)
Admission: RE | Admit: 2021-02-06 | Discharge: 2021-02-06 | Disposition: A | Discharge status: Home / Self Care | Payer: Managed Care, Other (non HMO) | Source: Ambulatory Visit | Attending: Obstetrics and Gynecology | Admitting: Obstetrics and Gynecology

## 2021-02-06 VITALS — BP 130/84 | Ht 64.0 in | Wt 137.0 lb

## 2021-02-06 DIAGNOSIS — R87810 Cervical high risk human papillomavirus (HPV) DNA test positive: Secondary | ICD-10-CM | POA: Insufficient documentation

## 2021-02-06 DIAGNOSIS — Z1339 Encounter for screening examination for other mental health and behavioral disorders: Secondary | ICD-10-CM

## 2021-02-06 DIAGNOSIS — Z01419 Encounter for gynecological examination (general) (routine) without abnormal findings: Secondary | ICD-10-CM | POA: Diagnosis not present

## 2021-02-06 DIAGNOSIS — Z1331 Encounter for screening for depression: Secondary | ICD-10-CM | POA: Diagnosis not present

## 2021-02-06 NOTE — Progress Notes (Signed)
Gynecology Annual Exam  PCP: Juline Patch, MD  Chief Complaint  Patient presents with  . Annual Exam   History of Present Illness:  Ms. Shannon Patterson is a 55 y.o. [phone] who LMP was No LMP recorded (lmp unknown). Patient has had a hysterectomy., presents today for her annual examination.  Her menses are absent due to supracervical  hysterectomy (still has ovaries).   She does have vasomotor sx. She used the topical estrogen patch for several years.  She is no longer on these She believes she has been going through menopause for about 5 years.  She is infrequently sexually active. She does not have vaginal dryness.  Last Pap: 11/2019  Results were: no abnormalities /neg HPV DNA positive (16/18 negative).  Hx of STDs: none  Last mammogram: 11/20/2019  Results were: BiRads 1 (initial screening mammogram was Birads 0, diagnostic was Birads 1). Recommended follow up 1 year for screening There is a FH of breast cancer in her mother and maternal grandmother. There is no FH of ovarian cancer. The patient does not do self-breast exams.  Colonoscopy: 01/2018 - 3 polyps - due in 2022. She has a colonoscopy tomorrow.  Dr. Vicente Males will do tomorrow.   Tobacco use: smokes 1 ppd x 19-20 years Alcohol use: She knows she drinks too much.  Exercise: moderately active  The patient wears seatbelts: yes.     Past Medical History:  Diagnosis Date  . Anxiety   . History of Papanicolaou smear of cervix 08/01/2012   -/-  . Hyperlipidemia   . Hyperthyroidism   . Internal hemorrhoid, bleeding 02/21/2018  . Osteoarthritis of carpometacarpal [facility]) joint of left thumb 01/21/2020  . SOB (shortness of breath) 09/28/2015  . SVT (supraventricular tachycardia) ([place])   . VBAC (vaginal birth after Cesarean) 1996    Past Surgical History:  Procedure Laterality Date  . ABDOMINAL HYSTERECTOMY  ~2000   HAS OVARIES/CX; PJR  . CESAREAN SECTION  1991   FTP; PJR  . COLONOSCOPY WITH PROPOFOL N/A 01/30/2018    Procedure: COLONOSCOPY WITH PROPOFOL;  Surgeon: Jonathon Bellows, MD;  Location: [facility] ENDOSCOPY;  Service: Gastroenterology;  Laterality: N/A;  . 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 ESOPHAGOGASTRODUODENOSCOPY (EGD) WITH PROPOFOL N/A 01/30/2018   Procedure: ESOPHAGOGASTRODUODENOSCOPY (EGD) WITH PROPOFOL;  Surgeon: Jonathon Bellows, MD;  Location: [facility] ENDOSCOPY;  Service: Gastroenterology;  Laterality: N/A;  . HEMORRHOID SURGERY N/A 03/05/2019   Procedure: HEMORRHOIDECTOMY;  Surgeon: Jules Husbands, MD;  Location: [place];  Service: General;  Laterality: N/A;  PROPOFOL  . HYSTEROSALPINGOGRAM  02/04/2009  . THYROIDECTOMY     PT WAS IN HER 20s    Prior to Admission medications   Medication Sig Start Date End Date Taking? Authorizing Provider  EPINEPHrine 0.3 mg/0.3 mL IJ SOAJ injection INJECT 0.3 MLS INTO MUSCLE ONCE. TAKE FOR SEVERE ALLERGIC REACTION, THEN COME TO ER OR CALL 911 06/14/18   [provider]  levothyroxine (SYNTHROID) 100 MCG tablet Take 100 mcg by mouth daily. [place]arti 09/09/15   [provider]  LORazepam (ATIVAN) 0.5 MG tablet Take 0.5 mg by mouth every 8 (eight) hours. [place]    [provider]  metoprolol succinate (TOPROL-XL) 50 MG 24 hr tablet Take 50 mg by mouth daily. Take with or immediately following a meal.    [provider]  polyethylene glycol (MIRALAX) packet Take 17 g by mouth daily. 01/30/18   Jonathon Bellows, MD  rosuvastatin (CRESTOR) 10 MG  tablet Take 10 mg by mouth daily. 09/27/15   [provider]  flecainide (TAMBOCOR) 50 MG tablet Take 50 mg by mouth 2 (two) times daily. 09/28/15 01/24/18  [provider]    Allergies  Allergen Reactions  . Lidocaine Other (See Comments)    tachycardia  . Prednisone     Other reaction(s): Very emotional -   . Procaine    Obstetric History: [id number], s/p c-section and VBAC  Family History  Problem Relation Age of Onset  . Breast  cancer Mother 72  . Cancer Mother   . Hypertension Mother   . Colon polyps Father   . Breast cancer Maternal Grandmother 80  . Cancer Maternal Grandmother     Social History   Socioeconomic History  . Marital status: Married    Spouse name: Not on file  . Number of children: Not on file  . Years of education: Not on file  . Highest education level: Not on file  Occupational History  . Not on file  Tobacco Use  . Smoking status: Current Every Day Smoker    Packs/day: 1.00    Years: 19.00    Pack years: 19.00  . Smokeless tobacco: Never Used  . Tobacco comment: Started age 6, (quit for 35 yrs), smoked last 10 yrs  Vaping Use  . Vaping Use: Never used  Substance and Sexual Activity  . Alcohol use: Yes    Alcohol/week: 7.0 standard drinks    Types: 7 Standard drinks or equivalent per week  . Drug use: No  . Sexual activity: Yes    Birth control/protection: Surgical  Other Topics Concern  . Not on file  Social History Narrative  . Not on file   Social Determinants of Health   Financial Resource Strain: Not on file  Food Insecurity: Not on file  Transportation Needs: Not on file  Physical Activity: Not on file  Stress: Not on file  Social Connections: Not on file  Intimate Partner Violence: Not on file    Review of Systems  Constitutional: Negative.   HENT: Negative.   Eyes: Negative.   Respiratory: Negative.   Cardiovascular: Negative.   Gastrointestinal: Negative.   Genitourinary: Negative.   Musculoskeletal: Negative.   Skin: Negative.   Neurological: Negative.   Psychiatric/Behavioral: Negative.       Physical Exam BP 130/84   Ht 5\' 4"  (1.626 m)   Wt 137 lb (62.1 kg)   LMP  (LMP Unknown)   BMI 23.52 kg/m   Physical Exam Constitutional:      General: She is not in acute distress.    Appearance: Normal appearance. She is well-developed.  Genitourinary:     Vulva and bladder normal.     No vaginal discharge, erythema, tenderness or bleeding.       Right Adnexa: not tender, not full and no mass present.    Left Adnexa: not tender, not full and no mass present.    No cervical motion tenderness, discharge, lesion or polyp.     Uterus is absent.     Pelvic exam was performed with patient in the lithotomy position.  Breasts:     Right: No inverted nipple, mass, nipple discharge, skin change or tenderness.     Left: No inverted nipple, mass, nipple discharge, skin change or tenderness.    HENT:     Head: Normocephalic and atraumatic.  Eyes:     General: No scleral icterus.    Conjunctiva/sclera: Conjunctivae normal.  Neck:  Thyroid: No thyromegaly.  Cardiovascular:     Rate and Rhythm: Normal rate and regular rhythm.     Heart sounds: No murmur heard. No friction rub. No gallop.   Pulmonary:     Effort: Pulmonary effort is normal. No respiratory distress.     Breath sounds: Normal breath sounds. No wheezing or rales.  Abdominal:     General: Bowel sounds are normal. There is no distension.     Palpations: Abdomen is soft. There is no mass.     Tenderness: There is no abdominal tenderness. There is no guarding or rebound.  Musculoskeletal:        General: No swelling or tenderness. Normal range of motion.     Cervical back: Normal range of motion and neck supple.  Lymphadenopathy:     Cervical: No cervical adenopathy.     Lower Body: No right inguinal adenopathy. No left inguinal adenopathy.  Neurological:     General: No focal deficit present.     Mental Status: She is alert and oriented to person, place, and time.     Cranial Nerves: No cranial nerve deficit.  Skin:    General: Skin is warm and dry.     Findings: No erythema or rash.  Psychiatric:        Mood and Affect: Mood normal.        Behavior: Behavior normal.        Judgment: Judgment normal.     Female chaperone present for pelvic and breast  portions of the physical exam  Results: AUDIT Questionnaire (screen for alcoholism): 10 PHQ-9:  1  Assessment: 55 y.o. [id number] here for routine gynecologic annual examination  Plan: Problem List Items Addressed This Visit   None   Visit Diagnoses    Women's annual routine gynecological examination    -  Primary   Screening for depression       Screening for alcoholism       Pap smear of cervix shows high risk HPV present       Relevant Orders   Cytology - PAP      Screening: -- Blood pressure screen elevated. Needs to see PCP for BP issues -- Colonoscopy - due. She states she is scheduled for tomorrow. -- Mammogram - due. Patient to call [place] to arrange. She understands that it is her responsibility to arrange this. -- Weight screening: normal -- Depression screening negative (PHQ-9) -- Nutrition: normal -- cholesterol screening: per PCP -- osteoporosis screening: not due -- tobacco screening: using: discussed quitting using the 5 A's -- alcohol screening: AUDIT questionnaire indicates possible high-risk usage. Discussed reducing and monitoring EtOH intake.  -- family history of breast cancer screening: done. not at high risk.  -- no evidence of domestic violence or intimate partner violence. -- STD screening: gonorrhea/chlamydia NAAT not collected per patient request. -- pap smear collected per ASCCP guidelines -- flu vaccine declined -- HPV vaccination series: not eligilbe  Prentice Docker, MD 02/06/2021 2:00 PM

## 2021-02-07 ENCOUNTER — Encounter: Payer: Self-pay | Admitting: Gastroenterology

## 2021-02-07 ENCOUNTER — Ambulatory Visit
Admission: RE | Admit: 2021-02-07 | Discharge: 2021-02-07 | Disposition: A | Discharge status: Home / Self Care | Payer: Managed Care, Other (non HMO) | Attending: Gastroenterology | Admitting: Gastroenterology

## 2021-02-07 ENCOUNTER — Other Ambulatory Visit: Payer: Self-pay

## 2021-02-07 ENCOUNTER — Ambulatory Visit: Payer: Managed Care, Other (non HMO) | Admitting: Anesthesiology

## 2021-02-07 ENCOUNTER — Encounter
Admission: RE | Disposition: A | Discharge status: Home / Self Care | Payer: Self-pay | Source: Home / Self Care | Attending: Gastroenterology

## 2021-02-07 DIAGNOSIS — D123 Benign neoplasm of transverse colon: Secondary | ICD-10-CM | POA: Insufficient documentation

## 2021-02-07 DIAGNOSIS — Z79899 Other long term (current) drug therapy: Secondary | ICD-10-CM | POA: Diagnosis not present

## 2021-02-07 DIAGNOSIS — K621 Rectal polyp: Secondary | ICD-10-CM | POA: Diagnosis not present

## 2021-02-07 DIAGNOSIS — D120 Benign neoplasm of cecum: Secondary | ICD-10-CM | POA: Insufficient documentation

## 2021-02-07 DIAGNOSIS — Z8249 Family history of ischemic heart disease and other diseases of the circulatory system: Secondary | ICD-10-CM | POA: Insufficient documentation

## 2021-02-07 DIAGNOSIS — Z809 Family history of malignant neoplasm, unspecified: Secondary | ICD-10-CM | POA: Diagnosis not present

## 2021-02-07 DIAGNOSIS — Z803 Family history of malignant neoplasm of breast: Secondary | ICD-10-CM | POA: Diagnosis not present

## 2021-02-07 DIAGNOSIS — E0590 Thyrotoxicosis, unspecified without thyrotoxic crisis or storm: Secondary | ICD-10-CM | POA: Insufficient documentation

## 2021-02-07 DIAGNOSIS — Z1211 Encounter for screening for malignant neoplasm of colon: Secondary | ICD-10-CM | POA: Diagnosis not present

## 2021-02-07 DIAGNOSIS — K635 Polyp of colon: Secondary | ICD-10-CM | POA: Diagnosis not present

## 2021-02-07 DIAGNOSIS — Z7952 Long term (current) use of systemic steroids: Secondary | ICD-10-CM | POA: Diagnosis not present

## 2021-02-07 DIAGNOSIS — K640 First degree hemorrhoids: Secondary | ICD-10-CM | POA: Diagnosis not present

## 2021-02-07 DIAGNOSIS — D125 Benign neoplasm of sigmoid colon: Secondary | ICD-10-CM | POA: Diagnosis not present

## 2021-02-07 DIAGNOSIS — Z8371 Family history of colonic polyps: Secondary | ICD-10-CM | POA: Insufficient documentation

## 2021-02-07 DIAGNOSIS — Z884 Allergy status to anesthetic agent status: Secondary | ICD-10-CM | POA: Diagnosis not present

## 2021-02-07 DIAGNOSIS — Z791 Long term (current) use of non-steroidal anti-inflammatories (NSAID): Secondary | ICD-10-CM | POA: Insufficient documentation

## 2021-02-07 DIAGNOSIS — Z888 Allergy status to other drugs, medicaments and biological substances status: Secondary | ICD-10-CM | POA: Diagnosis not present

## 2021-02-07 DIAGNOSIS — F17200 Nicotine dependence, unspecified, uncomplicated: Secondary | ICD-10-CM | POA: Diagnosis not present

## 2021-02-07 HISTORY — PX: COLONOSCOPY WITH PROPOFOL: SHX5780

## 2021-02-07 SURGERY — COLONOSCOPY WITH PROPOFOL
Anesthesia: General

## 2021-02-07 MED ORDER — PROPOFOL 10 MG/ML IV BOLUS
INTRAVENOUS | Status: DC | PRN
  Administered 2021-02-07: 100 mg via INTRAVENOUS

## 2021-02-07 MED ORDER — PROPOFOL 500 MG/50ML IV EMUL
INTRAVENOUS | Status: DC | PRN
  Administered 2021-02-07: 150 ug/kg/min via INTRAVENOUS

## 2021-02-07 MED ORDER — SODIUM CHLORIDE 0.9 % IV SOLN
INTRAVENOUS | Status: DC
  Administered 2021-02-07: 1000 mL via INTRAVENOUS

## 2021-02-07 NOTE — Anesthesia Preprocedure Evaluation (Addendum)
Anesthesia Evaluation  Patient identified by MRN, date of birth, ID band Patient awake    Reviewed: Allergy & Precautions, H&P , NPO status , Patient's Chart, lab work & pertinent test results  History of Anesthesia Complications Negative for: history of anesthetic complications  Airway Mallampati: II  TM Distance: >3 FB     Dental  (+) Chipped   Pulmonary neg sleep apnea, neg COPD, Current Smoker and Patient abstained from smoking.,    breath sounds clear to auscultation       Cardiovascular hypertension, (-) angina(-) Past MI and (-) Cardiac Stents (-) dysrhythmias  Rhythm:regular Rate:Normal     Neuro/Psych PSYCHIATRIC DISORDERS Anxiety negative neurological ROS     GI/Hepatic negative GI ROS, Neg liver ROS,   Endo/Other  Hypothyroidism Hyperthyroidism   Renal/GU negative Renal ROS  negative genitourinary   Musculoskeletal  (+) Arthritis ,   Abdominal   Peds  Hematology negative hematology ROS (+)   Anesthesia Other Findings Past Medical History: No date: Anxiety 08/01/2012: History of Papanicolaou smear of cervix     Comment:  -/- No date: Hyperlipidemia No date: Hyperthyroidism 02/21/2018: Internal hemorrhoid, bleeding 01/21/2020: Osteoarthritis of carpometacarpal [facility]) joint of left  thumb 09/28/2015: SOB (shortness of breath) No date: SVT (supraventricular tachycardia) ([place]) 1996: VBAC (vaginal birth after Cesarean)  Past Surgical History: ~2000: ABDOMINAL HYSTERECTOMY     Comment:  HAS OVARIES/CX; PJR No date: ABLATION OF DYSRHYTHMIC FOCUS 1991: CESAREAN SECTION     Comment:  FTP; PJR 01/30/2018: COLONOSCOPY WITH PROPOFOL; N/A     Comment:  Procedure: COLONOSCOPY WITH PROPOFOL;  Surgeon: Jonathon Bellows, MD;  Location[facility] ENDOSCOPY;  Service:               Gastroenterology;  Laterality: N/A; 10/19/2009: ENDOMETRIAL BIOPSY     Comment:  PJR; SIMPLE HYPERPLASIA W/O ATYPIA 01/30/2018:  ESOPHAGOGASTRODUODENOSCOPY (EGD) WITH PROPOFOL; N/A     Comment:  Procedure: ESOPHAGOGASTRODUODENOSCOPY (EGD) WITH               PROPOFOL;  Surgeon: Jonathon Bellows, MD;  Location: [facility]               ENDOSCOPY;  Service: Gastroenterology;  Laterality: N/A; 03/05/2019: HEMORRHOID SURGERY; N/A     Comment:  Procedure: HEMORRHOIDECTOMY;  Surgeon: Jules Husbands,               MD;  Location: [place];  Service: General;                Laterality: N/A;  PROPOFOL 02/04/2009: HYSTEROSALPINGOGRAM No date: THYROIDECTOMY     Comment:  PT WAS IN HER 20s  BMI    Body Mass Index: 22.81 kg/m      Reproductive/Obstetrics negative OB ROS                            Anesthesia Physical Anesthesia Plan  ASA: II  Anesthesia Plan: General   Post-op Pain Management:    Induction:   PONV Risk Score and Plan: Propofol infusion and TIVA  Airway Management Planned: Nasal Cannula  Additional Equipment:   Intra-op Plan:   Post-operative Plan:   Informed Consent: I have reviewed the patients History and Physical, chart, labs and discussed the procedure including the risks, benefits and alternatives for the proposed anesthesia with the patient or authorized representative who has indicated his/her understanding and acceptance.  Dental Advisory Given  Plan Discussed with: Anesthesiologist, CRNA and Surgeon  Anesthesia Plan Comments:         Anesthesia Quick Evaluation

## 2021-02-07 NOTE — Op Note (Signed)
[facility] Gastroenterology Patient Name: Hampton Cost Procedure Date: 02/07/2021 10:59 AM MRN: [id number] Account #: [id number] Date of Birth: 07-Nov-1965 Admit Type: Outpatient Age: 55 Room: [facility] ENDO ROOM 2 Gender: Female Note Status: Finalized Procedure:             Colonoscopy Indications:           Surveillance: Personal history of adenomatous polyps                         on last colonoscopy 3 years ago, Last colonoscopy:                         April 2019 Providers:             Jonathon Bellows MD, MD Referring MD:          Juline Patch, MD (Referring MD) Medicines:             Monitored Anesthesia Care Complications:         No immediate complications. Procedure:             Pre-Anesthesia Assessment:                        - Prior to the procedure, a History and Physical was                         performed, and patient medications, allergies and                         sensitivities were reviewed. The patient's tolerance                         of previous anesthesia was reviewed.                        - The risks and benefits of the procedure and the                         sedation options and risks were discussed with the                         patient. All questions were answered and informed                         consent was obtained.                        - ASA Grade Assessment: II - A patient with mild                         systemic disease.                        After obtaining informed consent, the colonoscope was                         passed under direct vision. Throughout the procedure,                         the patient's blood pressure, pulse, and oxygen  saturations were monitored continuously. The                         Colonoscope was introduced through the anus and                         advanced to the the cecum, identified by the                         appendiceal orifice. The colonoscopy was  performed                         with ease. The patient tolerated the procedure well.                         The quality of the bowel preparation was excellent. Findings:      The perianal and digital rectal examinations were normal.      Four sessile polyps were found in the rectum, sigmoid colon, transverse       colon and cecum. The polyps were 4 to 5 mm in size. These polyps were       removed with a cold snare. Resection and retrieval were complete.      Non-bleeding internal hemorrhoids were found during retroflexion. The       hemorrhoids were large and Grade I (internal hemorrhoids that do not       prolapse). Impression:            - Four 4 to 5 mm polyps in the rectum, in the sigmoid                         colon, in the transverse colon and in the cecum,                         removed with a cold snare. Resected and retrieved.                        - Non-bleeding internal hemorrhoids. Recommendation:        - Discharge patient to home (with escort).                        - Resume previous diet.                        - Continue present medications.                        - Await pathology results.                        - Repeat colonoscopy for surveillance and for                         surveillance based on pathology results. Procedure Code(s):     --- Professional ---                        [phone], Colonoscopy, flexible; with removal of  tumor(s), polyp(s), or other lesion(s) by snare                         technique Diagnosis Code(s):     --- Professional ---                        Z86.010, Personal history of colonic polyps                        K62.1, Rectal polyp                        K63.5, Polyp of colon                        K64.0, First degree hemorrhoids CPT copyright 2019 American Medical Association. All rights reserved. The codes documented in this report are preliminary and upon coder review may  be revised to meet current  compliance requirements. Jonathon Bellows, MD Jonathon Bellows MD, MD 02/07/2021 11:26:26 AM This report has been signed electronically. Number of Addenda: 0 Note Initiated On: 02/07/2021 10:59 AM Scope Withdrawal Time: 0 hours 9 minutes 59 seconds  Total Procedure Duration: 0 hours 12 minutes 19 seconds  Estimated Blood Loss:  Estimated blood loss: none.      [facility]

## 2021-02-07 NOTE — Transfer of Care (Signed)
Immediate Anesthesia Transfer of Care Note  Patient: Shannon Patterson  Procedure(s) Performed: COLONOSCOPY WITH PROPOFOL (N/A )  Patient Location: PACU and Endoscopy Unit  Anesthesia Type:General  Level of Consciousness: drowsy  Airway & Oxygen Therapy: Patient Spontanous Breathing  Post-op Assessment: Report given to RN  Post vital signs: stable  Last Vitals:  Vitals Value Taken Time  BP    Temp    Pulse    Resp    SpO2      Last Pain:  Vitals:   02/07/21 1024  TempSrc: Temporal  PainSc: 0-No pain         Complications: No complications documented.

## 2021-02-07 NOTE — H&P (Signed)
Jonathon Bellows, MD [address], [place], [place], [place], [address] [address], [place], [place], [place], [address] Phone: [phone]  Fax: [phone]  Primary Care Physician:  Juline Patch, MD   Pre-Procedure History & Physical: HPI:  Shannon Patterson is a 55 y.o. female is here for an colonoscopy.   Past Medical History:  Diagnosis Date  . Anxiety   . History of Papanicolaou smear of cervix 08/01/2012   -/-  . Hyperlipidemia   . Hyperthyroidism   . Internal hemorrhoid, bleeding 02/21/2018  . Osteoarthritis of carpometacarpal [facility]) joint of left thumb 01/21/2020  . SOB (shortness of breath) 09/28/2015  . SVT (supraventricular tachycardia) ([place])   . VBAC (vaginal birth after Cesarean) 1996    Past Surgical History:  Procedure Laterality Date  . ABDOMINAL HYSTERECTOMY  ~2000   HAS OVARIES/CX; PJR  . ABLATION OF DYSRHYTHMIC FOCUS    . CESAREAN SECTION  1991   FTP; PJR  . COLONOSCOPY WITH PROPOFOL N/A 01/30/2018   Procedure: COLONOSCOPY WITH PROPOFOL;  Surgeon: Jonathon Bellows, MD;  Location: [facility] ENDOSCOPY;  Service: Gastroenterology;  Laterality: N/A;  . 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 ESOPHAGOGASTRODUODENOSCOPY (EGD) WITH PROPOFOL N/A 01/30/2018   Procedure: ESOPHAGOGASTRODUODENOSCOPY (EGD) WITH PROPOFOL;  Surgeon: Jonathon Bellows, MD;  Location: [facility] ENDOSCOPY;  Service: Gastroenterology;  Laterality: N/A;  . HEMORRHOID SURGERY N/A 03/05/2019   Procedure: HEMORRHOIDECTOMY;  Surgeon: Jules Husbands, MD;  Location: [place];  Service: General;  Laterality: N/A;  PROPOFOL  . HYSTEROSALPINGOGRAM  02/04/2009  . THYROIDECTOMY     PT WAS IN HER 20s    Prior to Admission medications   Medication Sig Start Date End Date Taking? Authorizing Provider  levothyroxine (SYNTHROID) 112 MCG tablet Take 112 mcg by mouth daily before breakfast.   Yes [provider]  metoprolol succinate (TOPROL-XL) 50 MG 24 hr tablet Take 50 mg  by mouth daily. Take with or immediately following a meal.   Yes [provider]  polyethylene glycol (MIRALAX) packet Take 17 g by mouth daily. 01/30/18  Yes Jonathon Bellows, MD  rosuvastatin (CRESTOR) 10 MG tablet Take 10 mg by mouth daily. 09/27/15  Yes [provider]  EPINEPHrine 0.3 mg/0.3 mL IJ SOAJ injection INJECT 0.3 MLS INTO MUSCLE ONCE. TAKE FOR SEVERE ALLERGIC REACTION, THEN COME TO ER OR CALL 911 06/14/18   [provider]  fenofibrate (TRICOR) 145 MG tablet Take 145 mg by mouth daily. 08/21/20   [provider]  LORazepam (ATIVAN) 0.5 MG tablet Take 0.5 mg by mouth every 8 (eight) hours. [place]    [provider]  meloxicam (MOBIC) 15 MG tablet Take 1 tablet (15 mg total) by mouth daily. Patient not taking: No sig reported 01/21/20   Karen Kitchens, NP  triamcinolone cream (KENALOG) 0.1 % Apply 1 application topically 2 (two) times daily. Patient not taking: No sig reported 01/21/20   Karen Kitchens, NP  flecainide (TAMBOCOR) 50 MG tablet Take 50 mg by mouth 2 (two) times daily. 09/28/15 01/24/18  [provider]    Allergies as of 02/06/2021 - Review Complete 02/06/2021  Allergen Reaction Noted  . Lidocaine Other (See Comments) 12/07/2015  . Prednisone  08/24/2014  . Procaine  01/24/2015    Family History  Problem Relation Age of Onset  . Breast cancer Mother 58  . Cancer Mother   . Hypertension Mother   . Colon polyps Father   . Breast  cancer Maternal Grandmother 64  . Cancer Maternal Grandmother     Social History   Socioeconomic History  . Marital status: Married    Spouse name: Not on file  . Number of children: Not on file  . Years of education: Not on file  . Highest education level: Not on file  Occupational History  . Not on file  Tobacco Use  . Smoking status: Current Every Day Smoker    Packs/day: 1.00    Years: 19.00    Pack years: 19.00  . Smokeless tobacco: Never Used  . Tobacco comment: Started age  73, (quit for 107 yrs), smoked last 10 yrs  Vaping Use  . Vaping Use: Never used  Substance and Sexual Activity  . Alcohol use: Yes    Alcohol/week: 7.0 standard drinks    Types: 7 Standard drinks or equivalent per week  . Drug use: No  . Sexual activity: Yes    Birth control/protection: Surgical  Other Topics Concern  . Not on file  Social History Narrative  . Not on file   Social Determinants of Health   Financial Resource Strain: Not on file  Food Insecurity: Not on file  Transportation Needs: Not on file  Physical Activity: Not on file  Stress: Not on file  Social Connections: Not on file  Intimate Partner Violence: Not on file    Review of Systems: See HPI, otherwise negative ROS  Physical Exam: BP (!) 174/107   Pulse 69   Temp (!) 96.9 F (36.1 C) (Temporal)   Resp 16   Ht 5\' 4"  (1.626 m)   Wt 60.3 kg   LMP  (LMP Unknown)   SpO2 100%   BMI 22.81 kg/m  General:   Alert,  pleasant and cooperative in NAD Head:  Normocephalic and atraumatic. Neck:  Supple; no masses or thyromegaly. Lungs:  Clear throughout to auscultation, normal respiratory effort.    Heart:  +S1, +S2, Regular rate and rhythm, No edema. Abdomen:  Soft, nontender and nondistended. Normal bowel sounds, without guarding, and without rebound.   Neurologic:  Alert and  oriented x4;  grossly normal neurologically.  Impression/Plan: Shannon Patterson is here for an colonoscopy to be performed for surveillance due to prior history of colon polyps   Risks, benefits, limitations, and alternatives regarding  colonoscopy have been reviewed with the patient.  Questions have been answered.  All parties agreeable.   Jonathon Bellows, MD  02/07/2021, 10:59 AM

## 2021-02-08 ENCOUNTER — Encounter: Payer: Self-pay | Admitting: Gastroenterology

## 2021-02-08 LAB — SURGICAL PATHOLOGY

## 2021-02-09 ENCOUNTER — Encounter: Payer: Self-pay | Admitting: Gastroenterology

## 2021-02-09 LAB — CYTOLOGY - PAP
Comment: NEGATIVE
Diagnosis: NEGATIVE
High risk HPV: NEGATIVE

## 2021-02-09 NOTE — Anesthesia Postprocedure Evaluation (Signed)
Anesthesia Post Note  Patient: Shannon Patterson  Procedure(s) Performed: COLONOSCOPY WITH PROPOFOL (N/A )  Patient location during evaluation: PACU Anesthesia Type: General Level of consciousness: awake and alert Pain management: pain level controlled Vital Signs Assessment: post-procedure vital signs reviewed and stable Respiratory status: spontaneous breathing, nonlabored ventilation and respiratory function stable Cardiovascular status: blood pressure returned to baseline and stable Postop Assessment: no apparent nausea or vomiting Anesthetic complications: no   No complications documented.   Last Vitals:  Vitals:   02/07/21 1127 02/07/21 1137  BP: 117/80 (!) 139/93  Pulse:    Resp:    Temp: (!) 35.8 C   SpO2:      Last Pain:  Vitals:   02/08/21 0753  TempSrc:   PainSc: 0-No pain                 Tera Mater

## 2021-02-16 ENCOUNTER — Other Ambulatory Visit: Payer: Self-pay

## 2021-02-16 ENCOUNTER — Ambulatory Visit
Admission: RE | Admit: 2021-02-16 | Discharge: 2021-02-16 | Disposition: A | Discharge status: Home / Self Care | Payer: Managed Care, Other (non HMO) | Source: Ambulatory Visit | Attending: Obstetrics and Gynecology | Admitting: Obstetrics and Gynecology

## 2021-02-16 DIAGNOSIS — Z1231 Encounter for screening mammogram for malignant neoplasm of breast: Secondary | ICD-10-CM | POA: Diagnosis not present

## 2022-01-25 ENCOUNTER — Other Ambulatory Visit: Payer: Self-pay | Admitting: Gerontology

## 2022-01-25 DIAGNOSIS — Z1231 Encounter for screening mammogram for malignant neoplasm of breast: Secondary | ICD-10-CM

## 2022-03-07 ENCOUNTER — Ambulatory Visit
Admission: RE | Admit: 2022-03-07 | Discharge: 2022-03-07 | Disposition: A | Discharge status: Home / Self Care | Payer: 59 | Source: Ambulatory Visit | Attending: Gerontology | Admitting: Gerontology

## 2022-03-07 DIAGNOSIS — Z1231 Encounter for screening mammogram for malignant neoplasm of breast: Secondary | ICD-10-CM | POA: Insufficient documentation

## 2022-05-29 ENCOUNTER — Ambulatory Visit
Admission: RE | Admit: 2022-05-29 | Discharge: 2022-05-29 | Disposition: A | Discharge status: Home / Self Care | Payer: 59 | Source: Ambulatory Visit | Attending: Emergency Medicine | Admitting: Emergency Medicine

## 2022-05-29 VITALS — BP 163/93 | HR 72 | Temp 98.0°F | Ht 64.0 in | Wt 125.0 lb

## 2022-05-29 DIAGNOSIS — S29019A Strain of muscle and tendon of unspecified wall of thorax, initial encounter: Secondary | ICD-10-CM

## 2022-05-29 MED ORDER — BACLOFEN 10 MG PO TABS: 10.0000 mg | ORAL_TABLET | Freq: Three times a day (TID) | ORAL | 0 refills | Status: AC

## 2022-05-29 MED ORDER — IBUPROFEN 600 MG PO TABS: 600.0000 mg | ORAL_TABLET | Freq: Four times a day (QID) | ORAL | 0 refills | Status: AC | PRN

## 2022-05-29 NOTE — Discharge Instructions (Addendum)

## 2022-05-29 NOTE — ED Triage Notes (Signed)
Patient presents to UC for back pain.   Back pain started Friday. Patient denies any injury to her back.   Patient denies any urinary symptoms.

## 2022-05-29 NOTE — ED Provider Notes (Signed)
[facility]    CSN: [id number] Arrival date & time: 05/29/22  0841      History   Chief Complaint Chief Complaint  Patient presents with   Back Pain    HPI Shannon Patterson is a 56 y.o. female.   HPI  56 year old female here for evaluation of back pain.  Patient ports that she has been experiencing pain in her upper back on the right side for the last 4 days.  She states that yesterday it wraparound to the front of her chest.  The pain increases with movement and this morning she had some tingling in her fingers and a burning pain in her arm when she woke up.  She denies any shortness of breath, heavy lifting, or injury.  She does read a lot and has been doing a lot of puzzles.  She also has increased stress in her life and the fact that her husband just had to have emergency surgery for a ruptured bowel secondary to diverticulitis and she is dealing with his surgical wound recovery as well as a colostomy which is causing increased stress in her life.  Past Medical History:  Diagnosis Date   Anxiety    History of Papanicolaou smear of cervix 08/01/2012   -/-   Hyperlipidemia    Hyperthyroidism    Internal hemorrhoid, bleeding 02/21/2018   Osteoarthritis of carpometacarpal (CMC) joint of left thumb 01/21/2020   SOB (shortness of breath) 09/28/2015   SVT (supraventricular tachycardia) (HCC)    VBAC (vaginal birth after Cesarean) 1996    Patient Active Problem List   Diagnosis Date Noted   Grade III hemorrhoids    Anxiety 03/21/2017   Current smoker 03/21/2017   Elevated LDL cholesterol level 03/21/2017   Generalized abdominal pain 03/21/2017   Hypothyroidism, postop 03/21/2017   Pain in joint, multiple sites 03/21/2017   Atypical chest pain 09/28/2015   SVT (supraventricular tachycardia) ([place]) 09/28/2015   Peroneal tendinitis of left lower extremity 03/30/2015   Stress fracture of metatarsal bone of left foot 02/09/2015    Past Surgical History:  Procedure  Laterality Date   ABDOMINAL HYSTERECTOMY  ~2000   HAS OVARIES/CX; PJR   ABLATION OF DYSRHYTHMIC FOCUS     CESAREAN SECTION  1991   FTP; PJR   COLONOSCOPY WITH PROPOFOL N/A 01/30/2018   Procedure: COLONOSCOPY WITH PROPOFOL;  Surgeon: Jonathon Bellows, MD;  Location: [facility] ENDOSCOPY;  Service: Gastroenterology;  Laterality: N/A;   COLONOSCOPY WITH PROPOFOL N/A 02/07/2021   Procedure: COLONOSCOPY WITH PROPOFOL;  Surgeon: Jonathon Bellows, MD;  Location: [facility] ENDOSCOPY;  Service: Gastroenterology;  Laterality: N/A;   ENDOMETRIAL BIOPSY  10/19/2009   PJR; SIMPLE HYPERPLASIA W/O ATYPIA   ESOPHAGOGASTRODUODENOSCOPY (EGD) WITH PROPOFOL N/A 01/30/2018   Procedure: ESOPHAGOGASTRODUODENOSCOPY (EGD) WITH PROPOFOL;  Surgeon: Jonathon Bellows, MD;  Location: [facility] ENDOSCOPY;  Service: Gastroenterology;  Laterality: N/A;   HEMORRHOID SURGERY N/A 03/05/2019   Procedure: HEMORRHOIDECTOMY;  Surgeon: Jules Husbands, MD;  Location: [place];  Service: General;  Laterality: N/A;  PROPOFOL   HYSTEROSALPINGOGRAM  02/04/2009   THYROIDECTOMY     PT WAS IN HER 20s    OB History     Gravida  2   Para  2   Term  2   Preterm      AB      Living  2      SAB      IAB      Ectopic      Multiple  Live Births  2            Home Medications    Prior to Admission medications   Medication Sig Start Date End Date Taking? Authorizing Provider  baclofen (LIORESAL) 10 MG tablet Take 1 tablet (10 mg total) by mouth 3 (three) times daily. 05/29/22  Yes Margarette Canada, NP  EPINEPHrine 0.3 mg/0.3 mL IJ SOAJ injection INJECT 0.3 MLS INTO MUSCLE ONCE. TAKE FOR SEVERE ALLERGIC REACTION, THEN COME TO ER OR CALL 911 06/14/18  Yes [provider]  fenofibrate (TRICOR) 145 MG tablet Take 145 mg by mouth daily. 08/21/20  Yes [provider]  ibuprofen (ADVIL) 600 MG tablet Take 1 tablet (600 mg total) by mouth every 6 (six) hours as needed. 05/29/22  Yes Margarette Canada, NP  levothyroxine (SYNTHROID) 112  MCG tablet Take 112 mcg by mouth daily before breakfast.   Yes [provider]  LORazepam (ATIVAN) 0.5 MG tablet Take 0.5 mg by mouth every 8 (eight) hours. Morayarti   Yes [provider]  metoprolol succinate (TOPROL-XL) 50 MG 24 hr tablet Take 50 mg by mouth daily. Take with or immediately following a meal.   Yes [provider]  polyethylene glycol (MIRALAX) packet Take 17 g by mouth daily. 01/30/18  Yes Jonathon Bellows, MD  rosuvastatin (CRESTOR) 10 MG tablet Take 10 mg by mouth daily. 09/27/15  Yes [provider]  triamcinolone cream (KENALOG) 0.1 % Apply 1 application topically 2 (two) times daily. 01/21/20  Yes Karen Kitchens, NP  flecainide (TAMBOCOR) 50 MG tablet Take 50 mg by mouth 2 (two) times daily. 09/28/15 01/24/18  [provider]    Family History Family History  Problem Relation Age of Onset   Breast cancer Mother 51   Cancer Mother    Hypertension Mother    Colon polyps Father    Breast cancer Maternal Grandmother 50   Cancer Maternal Grandmother     Social History Social History   Tobacco Use   Smoking status: Every Day    Packs/day: 1.00    Years: 19.00    Total pack years: 19.00    Types: Cigarettes   Smokeless tobacco: Never   Tobacco comments:    Started age 80, (quit for 32 yrs), smoked last 10 yrs  Vaping Use   Vaping Use: Never used  Substance Use Topics   Alcohol use: Yes    Alcohol/week: 7.0 standard drinks of alcohol    Types: 7 Standard drinks or equivalent per week   Drug use: No     Allergies   Lidocaine, Prednisone, and Procaine   Review of Systems Review of Systems  Constitutional:  Negative for fever.  Respiratory:  Negative for cough and shortness of breath.   Musculoskeletal:  Positive for back pain.  Neurological:        Tingling and burning in her right arm and fingers.     Physical Exam Triage Vital Signs ED Triage Vitals  Enc Vitals Group     BP 05/29/22 0911 (!) 163/93     Pulse  Rate 05/29/22 0911 72     Resp --      Temp 05/29/22 0911 98 F (36.7 C)     Temp Source 05/29/22 0911 Oral     SpO2 05/29/22 0911 100 %     Weight 05/29/22 0910 125 lb (56.7 kg)     Height 05/29/22 0910 '5\' 4"'$  (1.626 m)     Head Circumference --  Peak Flow --      Pain Score 05/29/22 0909 4     Pain Loc --      Pain Edu? --      Excl. in [place]? --    No data found.  Updated Vital Signs BP (!) 163/93 (BP Location: Left Arm)   Pulse 72   Temp 98 F (36.7 C) (Oral)   Ht '5\' 4"'$  (1.626 m)   Wt 125 lb (56.7 kg)   LMP  (LMP Unknown)   SpO2 100%   BMI 21.46 kg/m   Visual Acuity Right Eye Distance:   Left Eye Distance:   Bilateral Distance:    Right Eye Near:   Left Eye Near:    Bilateral Near:     Physical Exam Vitals and nursing note reviewed.  Constitutional:      Appearance: Normal appearance. She is not ill-appearing.  HENT:     Head: Normocephalic and atraumatic.  Cardiovascular:     Rate and Rhythm: Normal rate and regular rhythm.     Pulses: Normal pulses.     Heart sounds: Normal heart sounds. No murmur heard.    No friction rub. No gallop.  Pulmonary:     Effort: Pulmonary effort is normal.     Breath sounds: Normal breath sounds. No wheezing, rhonchi or rales.  Chest:     Chest wall: No tenderness.  Musculoskeletal:        General: Tenderness present. No swelling, deformity or signs of injury.  Skin:    General: Skin is warm and dry.     Capillary Refill: Capillary refill takes less than 2 seconds.  Neurological:     General: No focal deficit present.     Mental Status: She is alert and oriented to person, place, and time.     Sensory: No sensory deficit.     Motor: No weakness.  Psychiatric:        Mood and Affect: Mood normal.        Behavior: Behavior normal.        Thought Content: Thought content normal.        Judgment: Judgment normal.      UC Treatments / Results  Labs (all labs ordered are listed, but only abnormal results are  displayed) Labs Reviewed - No data to display  EKG   Radiology No results found.  Procedures Procedures (including critical care time)  Medications Ordered in UC Medications - No data to display  Initial Impression / Assessment and Plan / UC Course  I have reviewed the triage vital signs and the nursing notes.  Pertinent labs & imaging results that were available during my care of the patient were reviewed by me and considered in my medical decision making (see chart for details).  Patient is a very pleasant, nontoxic-appearing 56 year old female who is here for evaluation of 4 days worth of right mid thoracic back pain that will occasionally wrap around to the front of her right chest.  Is not there currently.  Not causing any chest pain or shortness of breath.  She does endorse some tingling and burning in her right arm this morning when she woke up but that has resolved.  She denies any injury or heavy lifting.  She does have significant increase stress in her life taking care of her husband who recently had emergency surgery for a ruptured bowel and now has a colostomy as well as a surgical wound that needs managed.  The pain is  worse with movement and patient reports that she feels better when she is standing and it hurts to sit.  Physical exam reveals a benign cardiopulmonary exam with S1-S2 heart sounds with regular rate and rhythm and lung sounds are" all fields.  Anterior chest wall has no tenderness to palpation.  Patient does have a trigger point in her mid thoracic right paraspinous region between her scapula and vertebrae.  This is either inflammation of her rhomboid muscle or trapezius I believe.  Her bilateral upper extremity grips are 5/5 in bilateral upper extremity strength is 5/5.  Resisted extension, flexion, and abduction do not increase her pain.  I will treat her for a thoracic strain with ibuprofen, baclofen, and home physical therapy.  Have also discussed using moist heat  to improve blood flow and help ease the muscle tension.  Patient verbalized understanding of same.   Final Clinical Impressions(s) / UC Diagnoses   Final diagnoses:  Thoracic myofascial strain, initial encounter     Discharge Instructions      Take the ibuprofen, 600 mg every 6 hours with food, on a schedule for the next 48 hours and then as needed.  Take the baclofen, 10 mg every 8 hours, on a schedule for the next 48 hours and then as needed.  Apply moist heat to your back for 30 minutes at a time 2-3 times a day to improve blood flow to the area and help remove the lactic acid causing the spasm.  Follow the back exercises given at discharge.  Return for reevaluation for any new or worsening symptoms.      ED Prescriptions     Medication Sig Dispense Auth. Provider   ibuprofen (ADVIL) 600 MG tablet Take 1 tablet (600 mg total) by mouth every 6 (six) hours as needed. 30 tablet Margarette Canada, NP   baclofen (LIORESAL) 10 MG tablet Take 1 tablet (10 mg total) by mouth 3 (three) times daily. 46 each Margarette Canada, NP      PDMP not reviewed this encounter.   Margarette Canada, NP 05/29/22 0930

## 2023-01-11 ENCOUNTER — Ambulatory Visit
Admission: EM | Admit: 2023-01-11 | Discharge: 2023-01-11 | Disposition: A | Discharge status: Home / Self Care | Payer: 59 | Attending: Nurse Practitioner | Admitting: Nurse Practitioner

## 2023-01-11 ENCOUNTER — Ambulatory Visit (INDEPENDENT_AMBULATORY_CARE_PROVIDER_SITE_OTHER): Payer: 59

## 2023-01-11 DIAGNOSIS — M79641 Pain in right hand: Secondary | ICD-10-CM | POA: Diagnosis not present

## 2023-01-11 DIAGNOSIS — S60221A Contusion of right hand, initial encounter: Secondary | ICD-10-CM | POA: Diagnosis not present

## 2023-01-11 NOTE — ED Provider Notes (Signed)
[facility]    CSN: [id number] Arrival date & time: 01/11/23  1100      History   Chief Complaint Chief Complaint  Patient presents with   Hand Injury    HPI Shannon Patterson is a 57 y.o. female presents for evaluation of hand pain.  Patient reports 2 days ago while working with a dog gate she accidentally hit the back of her right hand on the door frame.  She endorses bruising and pain with mild swelling.  No numbness or tingling.  No history of injuries or surgeries to the right hand in the past.  She took 1 dose of Advil with minimal improvement.  No other concerns at this time.   Hand Injury   Past Medical History:  Diagnosis Date   Anxiety    History of Papanicolaou smear of cervix 08/01/2012   -/-   Hyperlipidemia    Hyperthyroidism    Internal hemorrhoid, bleeding 02/21/2018   Osteoarthritis of carpometacarpal (CMC) joint of left thumb 01/21/2020   SOB (shortness of breath) 09/28/2015   SVT (supraventricular tachycardia)    VBAC (vaginal birth after Cesarean) 1996    Patient Active Problem List   Diagnosis Date Noted   Grade III hemorrhoids    Anxiety 03/21/2017   Current smoker 03/21/2017   Elevated LDL cholesterol level 03/21/2017   Generalized abdominal pain 03/21/2017   Hypothyroidism, postop 03/21/2017   Pain in joint, multiple sites 03/21/2017   Atypical chest pain 09/28/2015   SVT (supraventricular tachycardia) 09/28/2015   Peroneal tendinitis of left lower extremity 03/30/2015   Stress fracture of metatarsal bone of left foot 02/09/2015    Past Surgical History:  Procedure Laterality Date   ABDOMINAL HYSTERECTOMY  ~2000   HAS OVARIES/CX; PJR   ABLATION OF DYSRHYTHMIC FOCUS     CESAREAN SECTION  1991   FTP; PJR   COLONOSCOPY WITH PROPOFOL N/A 01/30/2018   Procedure: COLONOSCOPY WITH PROPOFOL;  Surgeon: Jonathon Bellows, MD;  Location: [facility] ENDOSCOPY;  Service: Gastroenterology;  Laterality: N/A;   COLONOSCOPY WITH PROPOFOL N/A 02/07/2021    Procedure: COLONOSCOPY WITH PROPOFOL;  Surgeon: Jonathon Bellows, MD;  Location[facility] ENDOSCOPY;  Service: Gastroenterology;  Laterality: N/A;   ENDOMETRIAL BIOPSY  10/19/2009   PJR; SIMPLE HYPERPLASIA W/O ATYPIA   ESOPHAGOGASTRODUODENOSCOPY (EGD) WITH PROPOFOL N/A 01/30/2018   Procedure: ESOPHAGOGASTRODUODENOSCOPY (EGD) WITH PROPOFOL;  Surgeon: Jonathon Bellows, MD;  Location: [facility] ENDOSCOPY;  Service: Gastroenterology;  Laterality: N/A;   HEMORRHOID SURGERY N/A 03/05/2019   Procedure: HEMORRHOIDECTOMY;  Surgeon: Jules Husbands, MD;  Location: [place];  Service: General;  Laterality: N/A;  PROPOFOL   HYSTEROSALPINGOGRAM  02/04/2009   THYROIDECTOMY     PT WAS IN HER 20s    OB History     Gravida  2   Para  2   Term  2   Preterm      AB      Living  2      SAB      IAB      Ectopic      Multiple      Live Births  2            Home Medications    Prior to Admission medications   Medication Sig Start Date End Date Taking? Authorizing Provider  baclofen (LIORESAL) 10 MG tablet Take 1 tablet (10 mg total) by mouth 3 (three) times daily. 05/29/22  Yes Margarette Canada, NP  EPINEPHrine 0.3 mg/0.3 mL IJ  SOAJ injection INJECT 0.3 MLS INTO MUSCLE ONCE. TAKE FOR SEVERE ALLERGIC REACTION, THEN COME TO ER OR CALL 911 06/14/18  Yes [provider]  fenofibrate (TRICOR) 145 MG tablet Take 145 mg by mouth daily. 08/21/20  Yes [provider]  ibuprofen (ADVIL) 600 MG tablet Take 1 tablet (600 mg total) by mouth every 6 (six) hours as needed. 05/29/22  Yes Margarette Canada, NP  levothyroxine (SYNTHROID) 112 MCG tablet Take 112 mcg by mouth daily before breakfast.   Yes [provider]  LORazepam (ATIVAN) 0.5 MG tablet Take 0.5 mg by mouth every 8 (eight) hours. Morayarti   Yes [provider]  metoprolol succinate (TOPROL-XL) 50 MG 24 hr tablet Take 50 mg by mouth daily. Take with or immediately following a meal.   Yes [provider]   polyethylene glycol (MIRALAX) packet Take 17 g by mouth daily. 01/30/18  Yes Jonathon Bellows, MD  rosuvastatin (CRESTOR) 10 MG tablet Take 10 mg by mouth daily. 09/27/15  Yes [provider]  triamcinolone cream (KENALOG) 0.1 % Apply 1 application topically 2 (two) times daily. 01/21/20  Yes Karen Kitchens, NP  flecainide (TAMBOCOR) 50 MG tablet Take 50 mg by mouth 2 (two) times daily. 09/28/15 01/24/18  [provider]    Family History Family History  Problem Relation Age of Onset   Breast cancer Mother 22   Cancer Mother    Hypertension Mother    Colon polyps Father    Breast cancer Maternal Grandmother 49   Cancer Maternal Grandmother     Social History Social History   Tobacco Use   Smoking status: Every Day    Packs/day: 1.00    Years: 19.00    Additional pack years: 0.00    Total pack years: 19.00    Types: Cigarettes   Smokeless tobacco: Never   Tobacco comments:    Started age 25, (quit for 12 yrs), smoked last 10 yrs  Vaping Use   Vaping Use: Never used  Substance Use Topics   Alcohol use: Yes    Alcohol/week: 7.0 standard drinks of alcohol    Types: 7 Standard drinks or equivalent per week   Drug use: No     Allergies   Lidocaine, Prednisone, and Procaine   Review of Systems Review of Systems  Musculoskeletal:        Right hand pain      Physical Exam Triage Vital Signs ED Triage Vitals  Enc Vitals Group     BP 01/11/23 1112 (!) 173/111     Pulse Rate 01/11/23 1112 77     Resp 01/11/23 1112 16     Temp 01/11/23 1112 99.2 F (37.3 C)     Temp Source 01/11/23 1112 Oral     SpO2 01/11/23 1112 100 %     Weight 01/11/23 1111 130 lb (59 kg)     Height 01/11/23 1111 5\' 4"  (1.626 m)     Head Circumference --      Peak Flow --      Pain Score 01/11/23 1111 0     Pain Loc --      Pain Edu? --      Excl. in [place]? --    No data found.  Updated Vital Signs BP (!) 173/111 (BP Location: Left Arm)   Pulse 77   Temp 99.2 F (37.3 C) (Oral)    Resp 16   Ht 5\' 4"  (1.626 m)   Wt 130 lb (59 kg)  LMP  (LMP Unknown)   SpO2 100%   BMI 22.31 kg/m   Visual Acuity Right Eye Distance:   Left Eye Distance:   Bilateral Distance:    Right Eye Near:   Left Eye Near:    Bilateral Near:     Physical Exam Vitals and nursing note reviewed.  Constitutional:      Appearance: Normal appearance.  HENT:     Head: Normocephalic and atraumatic.  Eyes:     Pupils: Pupils are equal, round, and reactive to light.  Cardiovascular:     Rate and Rhythm: Normal rate.  Pulmonary:     Effort: Pulmonary effort is normal.  Musculoskeletal:       Hands:     Comments: Mild ecchymosis with swelling overlying the right second through fourth metacarpals.  Tender to palpation to site.  Skin is intact without abrasions or lacerations.  No tenderness to palpation to digits.  Cap refill +2 in all digits.  Full range of motion of hand with tightness felt with flexion of fingers.  Skin:    General: Skin is warm and dry.  Neurological:     General: No focal deficit present.     Mental Status: She is alert and oriented to person, place, and time.  Psychiatric:        Mood and Affect: Mood normal.        Behavior: Behavior normal.      UC Treatments / Results  Labs (all labs ordered are listed, but only abnormal results are displayed) Labs Reviewed - No data to display  EKG   Radiology DG Hand Complete Right  Result Date: 01/11/2023 CLINICAL DATA:  Trauma EXAM: RIGHT HAND - COMPLETE 3 VIEW COMPARISON:  12/30/2013. FINDINGS: No acute fracture, dislocation or subluxation. Sclerosis and osteophyte formation at the first metacarpal-carpal joint consistent with degenerative joint disease. IMPRESSION: Degenerative changes base of the thumb. No acute osseous abnormalities. Electronically Signed   By: Sammie Bench M.D.   On: 01/11/2023 12:17    Procedures Procedures (including critical care time)  Medications Ordered in UC Medications - No data  to display  Initial Impression / Assessment and Plan / UC Course  I have reviewed the triage vital signs and the nursing notes.  Pertinent labs & imaging results that were available during my care of the patient were reviewed by me and considered in my medical decision making (see chart for details).     X-ray negative for fracture.  Discussed contusion RICE therapy and OTC analgesics as needed PCP follow-up if symptoms do not improve ER precautions reviewed and patient verbalized understanding Final Clinical Impressions(s) / UC Diagnoses   Final diagnoses:  Right hand pain  Contusion of right hand, initial encounter     Discharge Instructions      Follow-up with your PCP if your symptoms do not improve     ED Prescriptions   None    PDMP not reviewed this encounter.   Melynda Ripple, NP 01/11/23 [phone]

## 2023-01-11 NOTE — ED Triage Notes (Signed)
Pt c/o R hand injury x2 days, states she was messing with dog's gait & hand hit corner of door. Pain worse with certain movements.

## 2023-01-11 NOTE — Discharge Instructions (Addendum)
Follow-up with your PCP if your symptoms do not improve

## 2023-04-03 ENCOUNTER — Other Ambulatory Visit: Payer: Self-pay | Admitting: Gerontology

## 2023-04-03 DIAGNOSIS — Z1231 Encounter for screening mammogram for malignant neoplasm of breast: Secondary | ICD-10-CM

## 2023-04-22 ENCOUNTER — Ambulatory Visit
Admission: RE | Admit: 2023-04-22 | Discharge: 2023-04-22 | Disposition: A | Discharge status: Home / Self Care | Payer: 59 | Source: Ambulatory Visit | Attending: Gerontology | Admitting: Gerontology

## 2023-04-22 DIAGNOSIS — Z1231 Encounter for screening mammogram for malignant neoplasm of breast: Secondary | ICD-10-CM | POA: Diagnosis present

## 2023-10-07 IMAGING — MG MM DIGITAL SCREENING BILAT W/ TOMO AND CAD
8 series · 9 of 24 positions shown · non-contrast
Comparison: Previous exam(s).

CLINICAL DATA: Screening.

EXAM:
DIGITAL SCREENING BILATERAL MAMMOGRAM WITH TOMOSYNTHESIS AND CAD
TECHNIQUE: Bilateral screening digital craniocaudal and mediolateral oblique
mammograms were obtained. Bilateral screening digital breast
tomosynthesis was performed. The images were evaluated with
computer-aided detection.

[R MLO synth-2D]
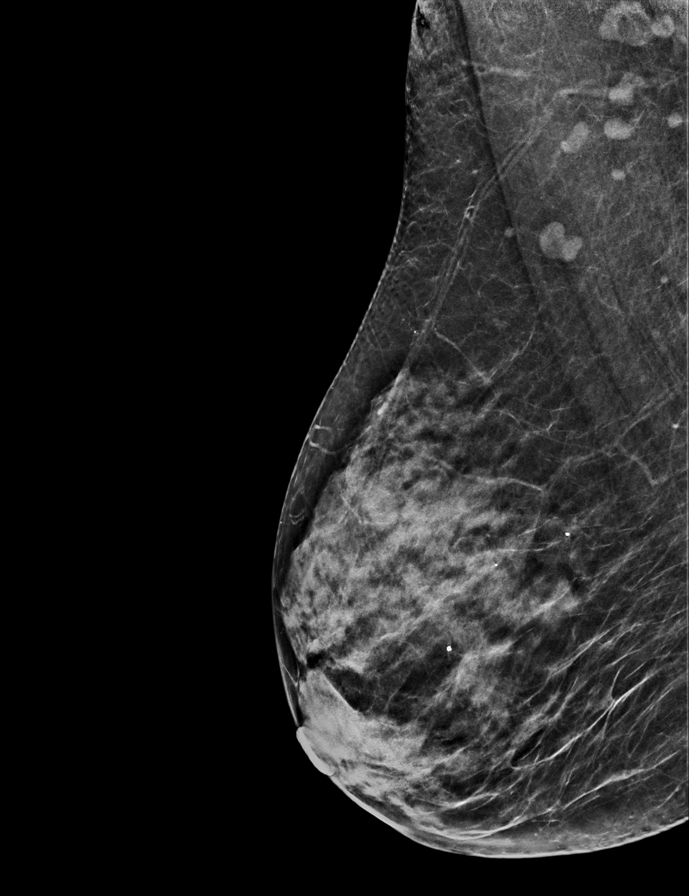

[L MLO synth-2D]
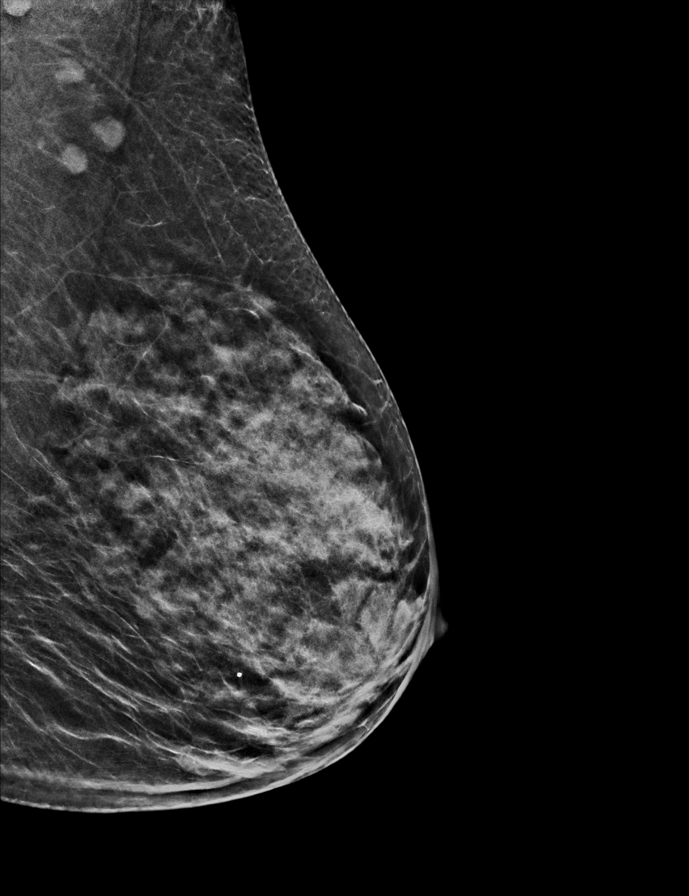

[R CC synth-2D]
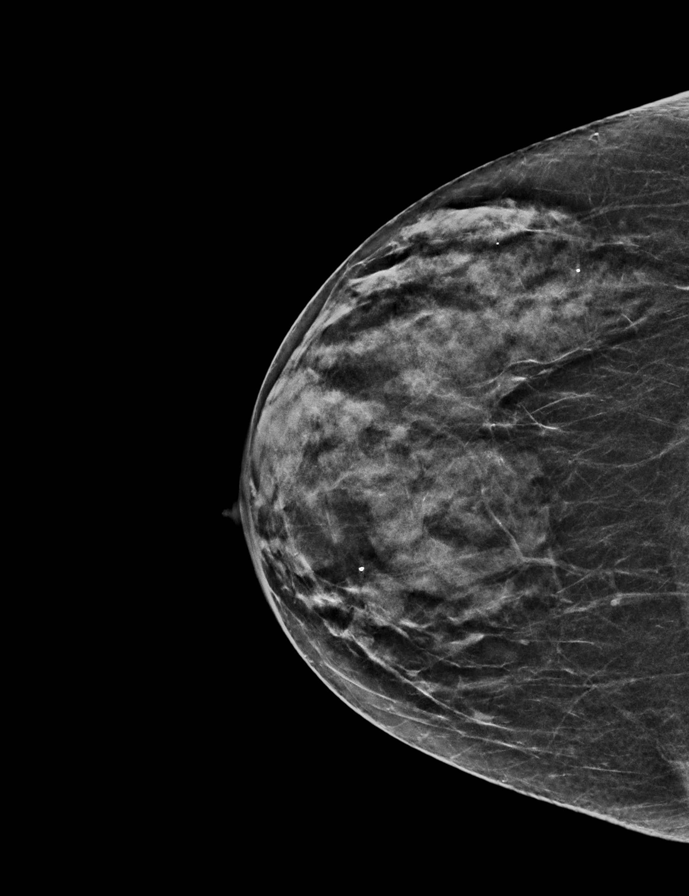

[L CC synth-2D]
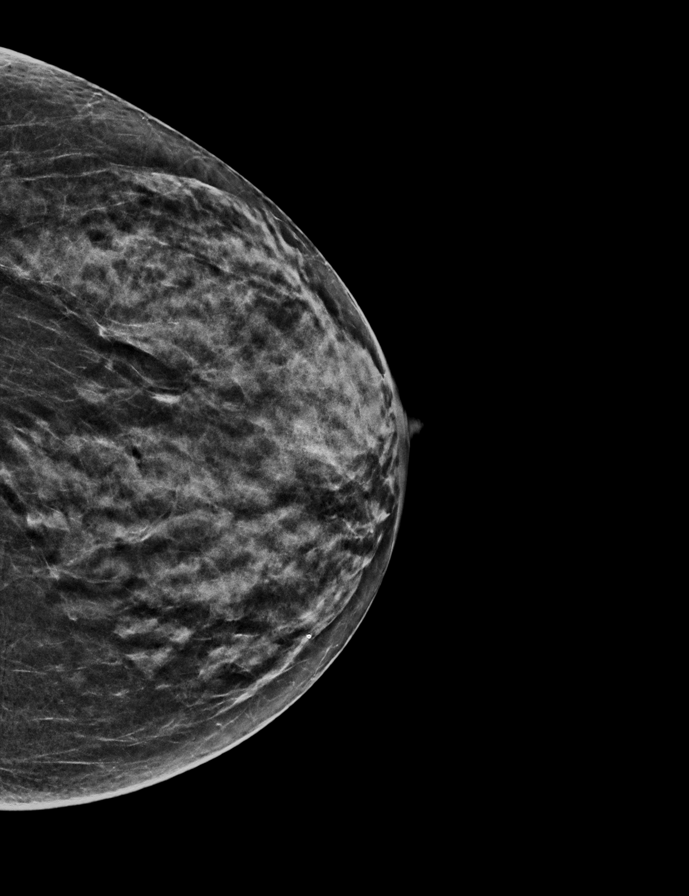

[R MLO tomo · 2 of 50 frames shown]
[frame 17/50]
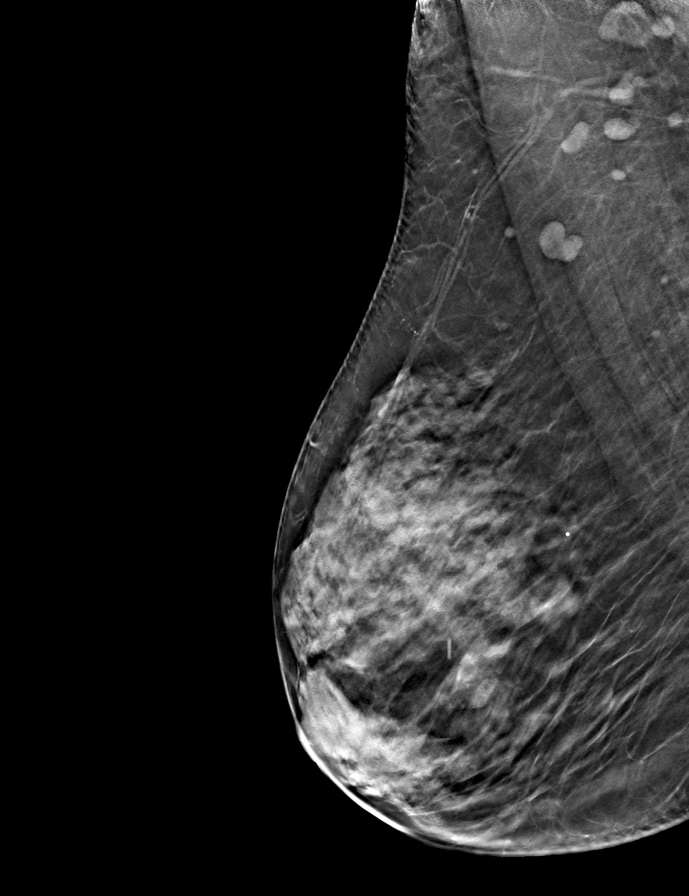
[frame 25/50]
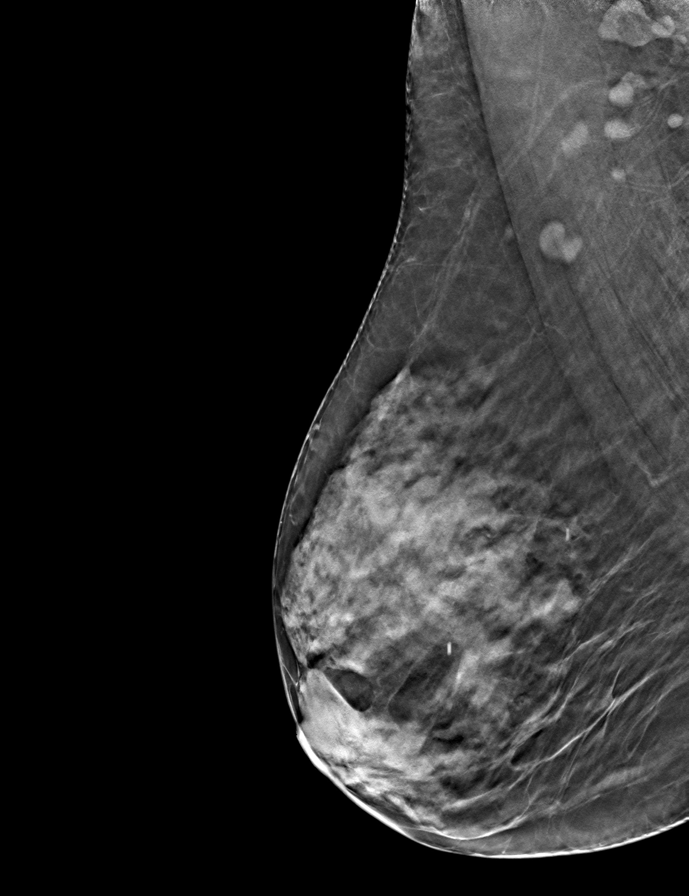

[R CC tomo · tomo slice 23/46.0]
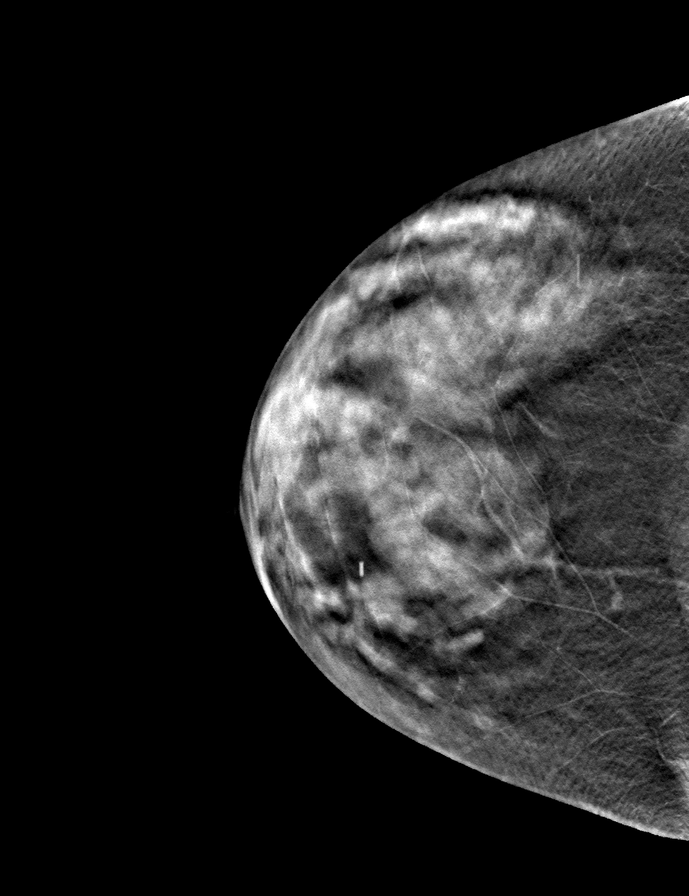

[L MLO tomo · tomo slice 25/49.0]
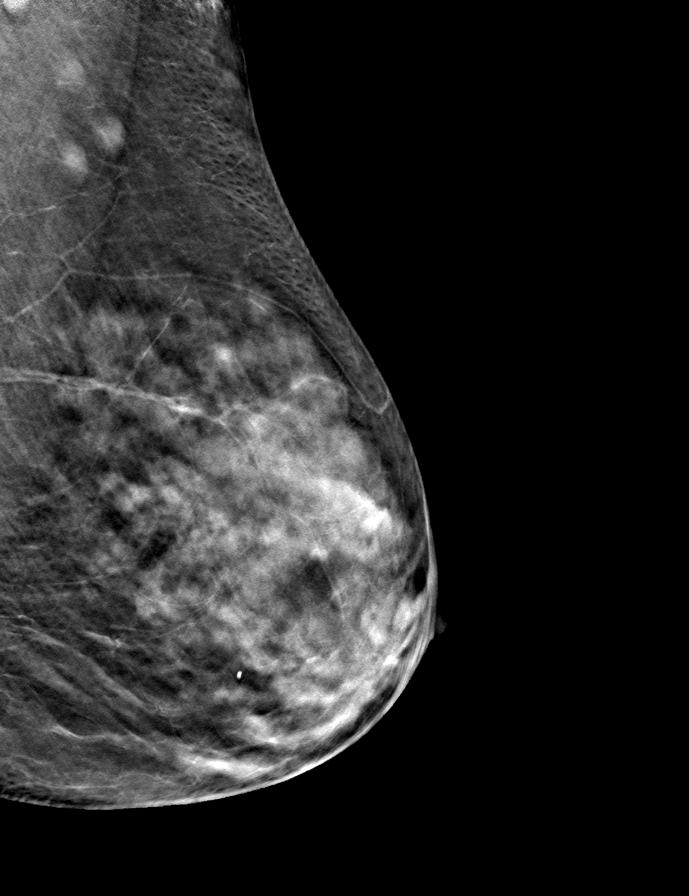

[L CC tomo · tomo slice 23/46.0]
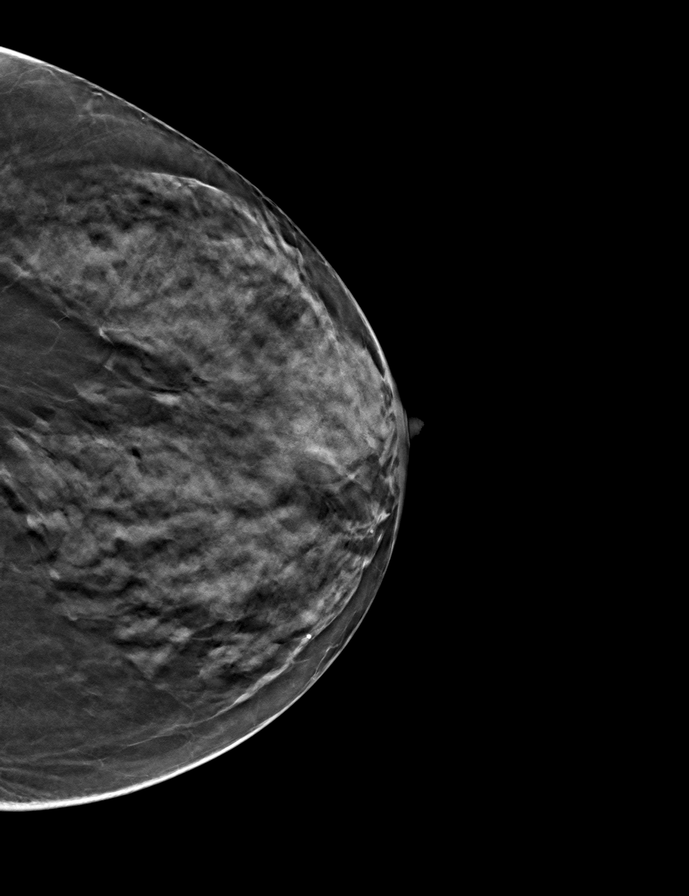

[9 of 24 positions shown; findings below may reference images not displayed]

ACR Breast Density Category c: The breast tissue is heterogeneously
dense, which may obscure small masses.
FINDINGS: There are no findings suspicious for malignancy.
IMPRESSION: No mammographic evidence of malignancy. A result letter of this
screening mammogram will be mailed directly to the patient.

RECOMMENDATION:
Screening mammogram in one year. (Code:[id number]-W-BC3)

BI-RADS CATEGORY  1: Negative.

## 2023-10-28 ENCOUNTER — Telehealth: Payer: Self-pay

## 2023-10-28 NOTE — Telephone Encounter (Signed)
 Patient left a voicemail and states she is having hemorrhoids issues again and wants to know what is recommend.

## 2023-11-13 NOTE — Telephone Encounter (Signed)
Please schedule an appointment with Dr. Tobi Bastos. Not an emergent. Therefore, first available. If she states that she needs to be seen sooner, she could then go and see her primary care doctor in the meantime.

## 2023-11-13 NOTE — Telephone Encounter (Signed)
I called the patient to schedule appointment there was no answer I left a voicemail.

## 2023-12-19 NOTE — Telephone Encounter (Signed)
 Patient was contacted again and she did not answer and left her a voicemail letting her know that we are not able to give her advise in reference to her hemorrhoids but to give [place] a call back to schedule an appointment.

## 2024-05-01 ENCOUNTER — Other Ambulatory Visit: Payer: Self-pay | Admitting: Gerontology

## 2024-05-01 DIAGNOSIS — Z1231 Encounter for screening mammogram for malignant neoplasm of breast: Secondary | ICD-10-CM

## 2024-05-05 NOTE — Progress Notes (Unsigned)
 ANNUAL PREVENTATIVE CARE GYNECOLOGY  ENCOUNTER NOTE  Subjective:       Shannon Patterson is a 58 y.o. G33P2002 female here for a routine annual gynecologic exam. The patient {is/is not/has never been:13135} sexually active. The patient {is/is not:13135} taking hormone replacement therapy. {post-men bleed:13152::Patient denies post-menopausal vaginal bleeding.} The patient wears seatbelts: {yes/no:311178}. The patient participates in regular exercise: {yes/no/not asked:9010}. Has the patient ever been transfused or tattooed?: {yes/no/not asked:9010}. The patient reports that there {is/is not:9024} domestic violence in her life.  Current complaints: 1.  ***    Gynecologic History No LMP recorded (lmp unknown). Patient has had a hysterectomy. Contraception: status post hysterectomy Last Pap: 02/06/2021. Results were: normal Last mammogram: 04/22/2023. Results were: normal Last Colonoscopy: 02/07/2021 (7 Years) Last Dexa Scan: Never done   Obstetric History OB History  Gravida Para Term Preterm AB Living  2 2 2   2   SAB IAB Ectopic Multiple Live Births      2    # Outcome Date GA Lbr Len/2nd Weight Sex Type Anes PTL Lv  2 Term 03/30/95   6 lb 11 oz (3.033 kg) F Vag-Spont   LIV  1 Term 11/15/89   8 lb 9.5 oz (3.898 kg) M CS-Unspec   LIV    Past Medical History:  Diagnosis Date   Anxiety    History of Papanicolaou smear of cervix 08/01/2012   -/-   Hyperlipidemia    Hyperthyroidism    Internal hemorrhoid, bleeding 02/21/2018   Osteoarthritis of carpometacarpal (CMC) joint of left thumb 01/21/2020   SOB (shortness of breath) 09/28/2015   SVT (supraventricular tachycardia)    VBAC (vaginal birth after Cesarean) 1996    Family History  Problem Relation Age of Onset   Breast cancer Mother 75   Cancer Mother    Hypertension Mother    Colon polyps Father    Breast cancer Maternal Grandmother 75   Cancer Maternal Grandmother     Past Surgical History:  Procedure Laterality  Date   ABDOMINAL HYSTERECTOMY  ~2000   HAS OVARIES/CX; PJR   ABLATION OF DYSRHYTHMIC FOCUS     CESAREAN SECTION  1991   FTP; PJR   COLONOSCOPY WITH PROPOFOL  N/A 01/30/2018   Procedure: COLONOSCOPY WITH PROPOFOL ;  Surgeon: Therisa Bi, MD;  Location: [facility] ENDOSCOPY;  Service: Gastroenterology;  Laterality: N/A;   COLONOSCOPY WITH PROPOFOL  N/A 02/07/2021   Procedure: COLONOSCOPY WITH PROPOFOL ;  Surgeon: Therisa Bi, MD;  Location: [facility] ENDOSCOPY;  Service: Gastroenterology;  Laterality: N/A;   ENDOMETRIAL BIOPSY  10/19/2009   PJR; SIMPLE HYPERPLASIA W/O ATYPIA   ESOPHAGOGASTRODUODENOSCOPY (EGD) WITH PROPOFOL  N/A 01/30/2018   Procedure: ESOPHAGOGASTRODUODENOSCOPY (EGD) WITH PROPOFOL ;  Surgeon: Therisa Bi, MD;  Location: [facility] ENDOSCOPY;  Service: Gastroenterology;  Laterality: N/A;   HEMORRHOID SURGERY N/A 03/05/2019   Procedure: HEMORRHOIDECTOMY;  Surgeon: Jordis Laneta FALCON, MD;  Location: [facility];  Service: General;  Laterality: N/A;  PROPOFOL    HYSTEROSALPINGOGRAM  02/04/2009   THYROIDECTOMY     PT WAS IN HER 20s    Social History   Socioeconomic History   Marital status: Married    Spouse name: Not on file   Number of children: Not on file   Years of education: Not on file   Highest education level: Not on file  Occupational History   Not on file  Tobacco Use   Smoking status: Every Day    Current packs/day: 1.00    Average packs/day: 1 pack/day for 19.0 years (19.0  ttl pk-yrs)    Types: Cigarettes   Smokeless tobacco: Never   Tobacco comments:    Started age 48, (quit for 13 yrs), smoked last 10 yrs  Vaping Use   Vaping status: Never Used  Substance and Sexual Activity   Alcohol use: Yes    Alcohol/week: 7.0 standard drinks of alcohol    Types: 7 Standard drinks or equivalent per week   Drug use: No   Sexual activity: Yes    Birth control/protection: Surgical  Other Topics Concern   Not on file  Social History Narrative   Not on file   Social Drivers of  Health   Financial Resource Strain: Not on file  Food Insecurity: Not on file  Transportation Needs: Not on file  Physical Activity: Not on file  Stress: Not on file  Social Connections: Not on file  Intimate Partner Violence: Not on file    Current Outpatient Medications on File Prior to Visit  Medication Sig Dispense Refill   baclofen  (LIORESAL ) 10 MG tablet Take 1 tablet (10 mg total) by mouth 3 (three) times daily. 30 each 0   EPINEPHrine  0.3 mg/0.3 mL IJ SOAJ injection INJECT 0.3 MLS INTO MUSCLE ONCE. TAKE FOR SEVERE ALLERGIC REACTION, THEN COME TO ER OR CALL 911  0   fenofibrate (TRICOR) 145 MG tablet Take 145 mg by mouth daily.     ibuprofen  (ADVIL ) 600 MG tablet Take 1 tablet (600 mg total) by mouth every 6 (six) hours as needed. 30 tablet 0   levothyroxine (SYNTHROID) 112 MCG tablet Take 112 mcg by mouth daily before breakfast.     LORazepam (ATIVAN) 0.5 MG tablet Take 0.5 mg by mouth every 8 (eight) hours. [place]     metoprolol succinate (TOPROL-XL) 50 MG 24 hr tablet Take 50 mg by mouth daily. Take with or immediately following a meal.     polyethylene glycol (MIRALAX ) packet Take 17 g by mouth daily. 14 each 0   rosuvastatin (CRESTOR) 10 MG tablet Take 10 mg by mouth daily.     triamcinolone  cream (KENALOG ) 0.1 % Apply 1 application topically 2 (two) times daily. 80 g 0   [DISCONTINUED] flecainide (TAMBOCOR) 50 MG tablet Take 50 mg by mouth 2 (two) times daily.     No current facility-administered medications on file prior to visit.    Allergies  Allergen Reactions   Lidocaine  Other (See Comments)    tachycardia   Prednisone      Other reaction(s): Very emotional -    Procaine       Review of Systems ROS Review of Systems - General ROS: negative for - chills, fatigue, fever, hot flashes, night sweats, weight gain or weight loss Psychological ROS: negative for - anxiety, decreased libido, depression, mood swings, physical abuse or sexual abuse Ophthalmic ROS:  negative for - blurry vision, eye pain or loss of vision ENT ROS: negative for - headaches, hearing change, visual changes or vocal changes Allergy and Immunology ROS: negative for - hives, itchy/watery eyes or seasonal allergies Hematological and Lymphatic ROS: negative for - bleeding problems, bruising, swollen lymph nodes or weight loss Endocrine ROS: negative for - galactorrhea, hair pattern changes, hot flashes, malaise/lethargy, mood swings, palpitations, polydipsia/polyuria, skin changes, temperature intolerance or unexpected weight changes Breast ROS: negative for - new or changing breast lumps or nipple discharge Respiratory ROS: negative for - cough or shortness of breath Cardiovascular ROS: negative for - chest pain, irregular heartbeat, palpitations or shortness of breath Gastrointestinal ROS: no abdominal  pain, change in bowel habits, or black or bloody stools Genito-Urinary ROS: no dysuria, trouble voiding, or hematuria Musculoskeletal ROS: negative for - joint pain or joint stiffness Neurological ROS: negative for - bowel and bladder control changes Dermatological ROS: negative for rash and skin lesion changes   Objective:   LMP  (LMP Unknown)  CONSTITUTIONAL: Well-developed, well-nourished female in no acute distress.  PSYCHIATRIC: Normal mood and affect. Normal behavior. Normal judgment and thought content. NEUROLGIC: Alert and oriented to person, place, and time. Normal muscle tone coordination. No cranial nerve deficit noted. HENT:  Normocephalic, atraumatic, External right and left ear normal. Oropharynx is clear and moist EYES: Conjunctivae and EOM are normal. Pupils are equal, round, and reactive to light. No scleral icterus.  NECK: Normal range of motion, supple, no masses.  Normal thyroid.  SKIN: Skin is warm and dry. No rash noted. Not diaphoretic. No erythema. No pallor. CARDIOVASCULAR: Normal heart rate noted, regular rhythm, no murmur. RESPIRATORY: Clear to  auscultation bilaterally. Effort and breath sounds normal, no problems with respiration noted. BREASTS: Symmetric in size. No masses, skin changes, nipple drainage, or lymphadenopathy. ABDOMEN: Soft, normal bowel sounds, no distention noted.  No tenderness, rebound or guarding.  BLADDER: Normal PELVIC:  Bladder {:311640}  Urethra: {:311719}  Vulva: {:311722}  Vagina: {:311643}  Cervix: {:311644}  Uterus: {:311718}  Adnexa: {:311645}  RV: {Blank multiple:19196::External Exam NormaI,No Rectal Masses,Normal Sphincter tone}  MUSCULOSKELETAL: Normal range of motion. No tenderness.  No cyanosis, clubbing, or edema.  2+ distal pulses. LYMPHATIC: No Axillary, Supraclavicular, or Inguinal Adenopathy.   Labs: Lab Results  Component Value Date   WBC 8.5 11/17/2018   HGB 14.9 11/17/2018   HCT 43.7 11/17/2018   MCV 98 (H) 11/17/2018   PLT 277 11/17/2018    Lab Results  Component Value Date   CREATININE 0.80 06/13/2018   BUN 13 06/13/2018   NA 141 06/13/2018   K 3.3 (L) 06/13/2018   CL 105 06/13/2018   CO2 25 06/13/2018    Lab Results  Component Value Date   ALT 19 06/13/2018   AST 29 06/13/2018   ALKPHOS 67 06/13/2018   BILITOT 0.9 06/13/2018    No results found for: CHOL, HDL, LDLCALC, LDLDIRECT, TRIG, CHOLHDL  No results found for: TSH  No results found for: HGBA1C   Assessment:   No diagnosis found.   Plan:  Pap: {Blank multiple:19196::Pap, Reflex if ASCUS,Pap Co Test,GC/CT NAAT,Not needed,Not done} Mammogram: Ordered Colon Screening:  UTD Labs: Pending Routine preventative health maintenance measures emphasized: {Blank multiple:19196::Exercise/Diet/Weight control,Tobacco Warnings,Alcohol/Substance use risks,Stress Management,Peer Pressure Issues,Safe Sex} Flu vaccine status: COVID Vaccination status: Return to Clinic - 1 Year   [employer], [place] [facility]

## 2024-05-05 NOTE — Patient Instructions (Signed)
 Preventive Care 39-56 Years Old, Female Preventive care refers to lifestyle choices and visits with your health care provider that can promote health and wellness. Preventive care visits are also called wellness exams. What can I expect for my preventive care visit? Counseling Your health care provider may ask you questions about your: Medical history, including: Past medical problems. Family medical history. Pregnancy history. Current health, including: Menstrual cycle. Method of birth control. Emotional well-being. Home life and relationship well-being. Sexual activity and sexual health. Lifestyle, including: Alcohol, nicotine or tobacco, and drug use. Access to firearms. Diet, exercise, and sleep habits. Work and work Astronomer. Sunscreen use. Safety issues such as seatbelt and bike helmet use. Physical exam Your health care provider will check your: Height and weight. These may be used to calculate your BMI (body mass index). BMI is a measurement that tells if you are at a healthy weight. Waist circumference. This measures the distance around your waistline. This measurement also tells if you are at a healthy weight and may help predict your risk of certain diseases, such as type 2 diabetes and high blood pressure. Heart rate and blood pressure. Body temperature. Skin for abnormal spots. What immunizations do I need?  Vaccines are usually given at various ages, according to a schedule. Your health care provider will recommend vaccines for you based on your age, medical history, and lifestyle or other factors, such as travel or where you work. What tests do I need? Screening Your health care provider may recommend screening tests for certain conditions. This may include: Lipid and cholesterol levels. Diabetes screening. This is done by checking your blood sugar (glucose) after you have not eaten for a while (fasting). Pelvic exam and Pap test. Hepatitis B test. Hepatitis C  test. HIV (human immunodeficiency virus) test. STI (sexually transmitted infection) testing, if you are at risk. Lung cancer screening. Colorectal cancer screening. Mammogram. Talk with your health care provider about when you should start having regular mammograms. This may depend on whether you have a family history of breast cancer. BRCA-related cancer screening. This may be done if you have a family history of breast, ovarian, tubal, or peritoneal cancers. Bone density scan. This is done to screen for osteoporosis. Talk with your health care provider about your test results, treatment options, and if necessary, the need for more tests. Follow these instructions at home: Eating and drinking  Eat a diet that includes fresh fruits and vegetables, whole grains, lean protein, and low-fat dairy products. Take vitamin and mineral supplements as recommended by your health care provider. Do not drink alcohol if: Your health care provider tells you not to drink. You are pregnant, may be pregnant, or are planning to become pregnant. If you drink alcohol: Limit how much you have to 0-1 drink a day. Know how much alcohol is in your drink. In the [place] one drink equals one 12 oz bottle of beer (355 mL), one 5 oz glass of wine (148 mL), or one 1 oz glass of hard liquor (44 mL). Lifestyle Brush your teeth every morning and night with fluoride toothpaste. Floss one time each day. Exercise for at least 30 minutes 5 or more days each week. Do not use any products that contain nicotine or tobacco. These products include cigarettes, chewing tobacco, and vaping devices, such as e-cigarettes. If you need help quitting, ask your health care provider. Do not use drugs. If you are sexually active, practice safe sex. Use a condom or other form of protection to  prevent STIs. If you do not wish to become pregnant, use a form of birth control. If you plan to become pregnant, see your health care provider for a  prepregnancy visit. Take aspirin only as told by your health care provider. Make sure that you understand how much to take and what form to take. Work with your health care provider to find out whether it is safe and beneficial for you to take aspirin daily. Find healthy ways to manage stress, such as: Meditation, yoga, or listening to music. Journaling. Talking to a trusted person. Spending time with friends and family. Minimize exposure to UV radiation to reduce your risk of skin cancer. Safety Always wear your seat belt while driving or riding in a vehicle. Do not drive: If you have been drinking alcohol. Do not ride with someone who has been drinking. When you are tired or distracted. While texting. If you have been using any mind-altering substances or drugs. Wear a helmet and other protective equipment during sports activities. If you have firearms in your house, make sure you follow all gun safety procedures. Seek help if you have been physically or sexually abused. What's next? Visit your health care provider once a year for an annual wellness visit. Ask your health care provider how often you should have your eyes and teeth checked. Stay up to date on all vaccines. This information is not intended to replace advice given to you by your health care provider. Make sure you discuss any questions you have with your health care provider. Document Revised: 04/05/2021 Document Reviewed: 04/05/2021 Elsevier Patient Education  2024 [employer] Breast Self-Awareness Breast self-awareness is knowing how your breasts look and feel. You need to: Check your breasts on a regular basis. Tell your doctor about any changes. Become familiar with the look and feel of your breasts. This can help you catch a breast problem while it is still small and can be treated. You should do breast self-exams even if you have breast implants. What you need: A mirror. A well-lit room. A pillow or other  soft object. How to do a breast self-exam Follow these steps to do a breast self-exam: Look for changes  Take off all the clothes above your waist. Stand in front of a mirror in a room with good lighting. Put your hands down at your sides. Compare your breasts in the mirror. Look for any difference between them, such as: A difference in shape. A difference in size. Wrinkles, dips, and bumps in one breast and not the other. Look at each breast for changes in the skin, such as: Redness. Scaly areas. Skin that has gotten thicker. Dimpling. Open sores (ulcers). Look for changes in your nipples, such as: Fluid coming out of a nipple. Fluid around a nipple. Bleeding. Dimpling. Redness. A nipple that looks pushed in (retracted), or that has changed position. Feel for changes Lie on your back. Feel each breast. To do this: Pick a breast to feel. Place a pillow under the shoulder closest to that breast. Put the arm closest to that breast behind your head. Feel the nipple area of that breast using the hand of your other arm. Feel the area with the pads of your three middle fingers by making small circles with your fingers. Use light, medium, and firm pressure. Continue the overlapping circles, moving downward over the breast. Keep making circles with your fingers. Stop when you feel your ribs. Start making circles with your fingers again, this time going  upward until you reach your collarbone. Then, make circles outward across your breast and into your armpit area. Squeeze your nipple. Check for discharge and lumps. Repeat these steps to check your other breast. Sit or stand in the tub or shower. With soapy water on your skin, feel each breast the same way you did when you were lying down. Write down what you find Writing down what you find can help you remember what to tell your doctor. Write down: What is normal for each breast. Any changes you find in each breast. These  include: The kind of changes you find. A tender or painful breast. Any lump you find. Write down its size and where it is. When you last had your monthly period (menstrual cycle). General tips If you are breastfeeding, the best time to check your breasts is after you feed your baby or after you use a breast pump. If you get monthly bleeding, the best time to check your breasts is 5-7 days after your monthly cycle ends. With time, you will become comfortable with the self-exam. You will also start to know if there are changes in your breasts. Contact a doctor if: You see a change in the shape or size of your breasts or nipples. You see a change in the skin of your breast or nipples, such as red or scaly skin. You have fluid coming from your nipples that is not normal. You find a new lump or thick area. You have breast pain. You have any concerns about your breast health. Summary Breast self-awareness includes looking for changes in your breasts and feeling for changes within your breasts. You should do breast self-awareness in front of a mirror in a well-lit room. If you get monthly periods (menstrual cycles), the best time to check your breasts is 5-7 days after your period ends. Tell your doctor about any changes you see in your breasts. Changes include changes in size, changes on the skin, painful or tender breasts, or fluid from your nipples that is not normal. This information is not intended to replace advice given to you by your health care provider. Make sure you discuss any questions you have with your health care provider. Document Revised: 03/15/2022 Document Reviewed: 08/10/2021 Elsevier Patient Education  2024 [employer].

## 2024-05-06 ENCOUNTER — Ambulatory Visit: Admitting: Certified Nurse Midwife

## 2024-05-06 VITALS — BP 160/98 | HR 61 | Resp 16 | Ht 64.0 in | Wt 136.5 lb

## 2024-05-06 DIAGNOSIS — Z01419 Encounter for gynecological examination (general) (routine) without abnormal findings: Secondary | ICD-10-CM

## 2024-06-09 ENCOUNTER — Ambulatory Visit
Admission: RE | Admit: 2024-06-09 | Discharge: 2024-06-09 | Disposition: A | Discharge status: Home / Self Care | Source: Ambulatory Visit | Attending: Gerontology | Admitting: Gerontology

## 2024-06-09 DIAGNOSIS — Z1231 Encounter for screening mammogram for malignant neoplasm of breast: Secondary | ICD-10-CM | POA: Diagnosis present

## 2024-11-10 ENCOUNTER — Ambulatory Visit: Payer: Self-pay

## 2024-11-10 DIAGNOSIS — Z09 Encounter for follow-up examination after completed treatment for conditions other than malignant neoplasm: Secondary | ICD-10-CM | POA: Diagnosis present

## 2024-11-10 DIAGNOSIS — K635 Polyp of colon: Secondary | ICD-10-CM | POA: Diagnosis not present

## 2024-11-10 DIAGNOSIS — Z860101 Personal history of adenomatous and serrated colon polyps: Secondary | ICD-10-CM | POA: Diagnosis not present
